# Patient Record
Sex: Female | Born: 1970 | Race: White | Hispanic: No | Marital: Married | Smoking: Never smoker
Health system: Southern US, Community
[De-identification: ages and names within clinical notes are randomized; demographics above are authoritative.]

## PROBLEM LIST (undated history)

## (undated) DIAGNOSIS — E669 Obesity, unspecified: Secondary | ICD-10-CM

## (undated) DIAGNOSIS — E079 Disorder of thyroid, unspecified: Secondary | ICD-10-CM

## (undated) DIAGNOSIS — I2510 Atherosclerotic heart disease of native coronary artery without angina pectoris: Secondary | ICD-10-CM

## (undated) DIAGNOSIS — D696 Thrombocytopenia, unspecified: Secondary | ICD-10-CM

## (undated) DIAGNOSIS — E559 Vitamin D deficiency, unspecified: Secondary | ICD-10-CM

## (undated) DIAGNOSIS — I10 Essential (primary) hypertension: Secondary | ICD-10-CM

## (undated) DIAGNOSIS — E785 Hyperlipidemia, unspecified: Secondary | ICD-10-CM

## (undated) DIAGNOSIS — I2699 Other pulmonary embolism without acute cor pulmonale: Secondary | ICD-10-CM

## (undated) DIAGNOSIS — R760 Raised antibody titer: Secondary | ICD-10-CM

## (undated) DIAGNOSIS — E039 Hypothyroidism, unspecified: Secondary | ICD-10-CM

## (undated) HISTORY — DX: Hyperlipidemia, unspecified: E78.5

## (undated) HISTORY — DX: Raised antibody titer: R76.0

## (undated) HISTORY — DX: Thrombocytopenia, unspecified: D69.6

## (undated) HISTORY — DX: Vitamin D deficiency, unspecified: E55.9

## (undated) HISTORY — DX: Atherosclerotic heart disease of native coronary artery without angina pectoris: I25.10

## (undated) HISTORY — DX: Obesity, unspecified: E66.9

---
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
## 1997-06-20 ENCOUNTER — Inpatient Hospital Stay (HOSPITAL_COMMUNITY): Admission: AD | Admit: 1997-06-20 | Discharge: 1997-06-22 | Payer: Self-pay | Admitting: Obstetrics and Gynecology

## 1997-07-17 ENCOUNTER — Other Ambulatory Visit: Admission: RE | Admit: 1997-07-17 | Discharge: 1997-07-17 | Payer: Self-pay | Admitting: Obstetrics and Gynecology

## 1999-02-17 ENCOUNTER — Other Ambulatory Visit: Admission: RE | Admit: 1999-02-17 | Discharge: 1999-02-17 | Payer: Self-pay | Admitting: Obstetrics and Gynecology

## 1999-08-27 ENCOUNTER — Encounter: Payer: Self-pay | Admitting: Family Medicine

## 1999-08-27 ENCOUNTER — Ambulatory Visit: Admission: RE | Admit: 1999-08-27 | Discharge: 1999-08-27 | Payer: Self-pay | Admitting: Family Medicine

## 2000-07-18 ENCOUNTER — Other Ambulatory Visit: Admission: RE | Admit: 2000-07-18 | Discharge: 2000-07-18 | Payer: Self-pay | Admitting: Obstetrics and Gynecology

## 2002-07-23 ENCOUNTER — Other Ambulatory Visit: Admission: RE | Admit: 2002-07-23 | Discharge: 2002-07-23 | Payer: Self-pay | Admitting: Obstetrics and Gynecology

## 2003-07-15 ENCOUNTER — Ambulatory Visit (HOSPITAL_COMMUNITY): Admission: RE | Admit: 2003-07-15 | Discharge: 2003-07-15 | Payer: Self-pay | Admitting: Obstetrics and Gynecology

## 2003-09-24 ENCOUNTER — Inpatient Hospital Stay (HOSPITAL_COMMUNITY): Admission: AD | Admit: 2003-09-24 | Discharge: 2003-09-27 | Payer: Self-pay | Admitting: Obstetrics and Gynecology

## 2003-10-02 ENCOUNTER — Inpatient Hospital Stay (HOSPITAL_COMMUNITY): Admission: AD | Admit: 2003-10-02 | Discharge: 2003-10-02 | Payer: Self-pay | Admitting: Obstetrics & Gynecology

## 2003-10-04 ENCOUNTER — Emergency Department (HOSPITAL_COMMUNITY): Admission: EM | Admit: 2003-10-04 | Discharge: 2003-10-04 | Payer: Self-pay | Admitting: Emergency Medicine

## 2003-10-30 ENCOUNTER — Other Ambulatory Visit: Admission: RE | Admit: 2003-10-30 | Discharge: 2003-10-30 | Payer: Self-pay | Admitting: Obstetrics and Gynecology

## 2004-02-01 ENCOUNTER — Encounter: Admission: RE | Admit: 2004-02-01 | Discharge: 2004-02-01 | Payer: Self-pay | Admitting: Internal Medicine

## 2004-04-15 ENCOUNTER — Ambulatory Visit (HOSPITAL_COMMUNITY): Admission: RE | Admit: 2004-04-15 | Discharge: 2004-04-15 | Payer: Self-pay | Admitting: Oncology

## 2004-04-15 ENCOUNTER — Ambulatory Visit: Payer: Self-pay | Admitting: Oncology

## 2004-07-04 ENCOUNTER — Ambulatory Visit: Payer: Self-pay | Admitting: Oncology

## 2004-08-19 ENCOUNTER — Ambulatory Visit: Payer: Self-pay | Admitting: Oncology

## 2004-10-31 ENCOUNTER — Other Ambulatory Visit: Admission: RE | Admit: 2004-10-31 | Discharge: 2004-10-31 | Payer: Self-pay | Admitting: Obstetrics and Gynecology

## 2004-12-21 ENCOUNTER — Ambulatory Visit: Payer: Self-pay | Admitting: Oncology

## 2005-07-11 ENCOUNTER — Ambulatory Visit: Payer: Self-pay | Admitting: Oncology

## 2006-08-16 ENCOUNTER — Encounter: Admission: RE | Admit: 2006-08-16 | Discharge: 2006-08-16 | Payer: Self-pay | Admitting: Internal Medicine

## 2008-09-03 ENCOUNTER — Encounter: Admission: RE | Admit: 2008-09-03 | Discharge: 2008-09-03 | Payer: Self-pay | Admitting: Internal Medicine

## 2010-08-26 NOTE — Discharge Summary (Signed)
Jill [name], Jill [name]                         ACCOUNT NO.:  [id number]   MEDICAL RECORD NO.:  [id number]                   PATIENT TYPE:  INP   LOCATION:  9107                                 FACILITY:  [facility]   PHYSICIAN:  Gerrit Friends. Aldona Bar, M.D.                DATE OF BIRTH:  [birth year]   DATE OF ADMISSION:  09/24/2003  DATE OF DISCHARGE:  09/27/2003                                 DISCHARGE SUMMARY   DISCHARGE DIAGNOSES:  1. A 38-week intrauterine pregnancy delivered of a 7 pound, 8 ounce female     infant with Apgar's 9 and 9.  2. Blood type:  A negative, RhoGAM given.  3. History of a pulmonary embolus.  4. Preeclampsia.  5. Induction of labor.   PROCEDURE:  Normal spontaneous delivery.   SUMMARY:  This 40 year old gravida 3, para 2 was admitted at 71 to [redacted] weeks  gestation for induction secondary to preeclampsia.  Her history is  significant in that there is a history of a pulmonary embolus and she was on  Coumadin prior to getting pregnant, and was placed on Lovenox and continued  on Lovenox during her pregnancy.  She was using 40 mg subcutaneously on a  daily basis.  At the time of admission her blood pressures were in the  140/90 range.  Cytotec was placed and had excellent results and put the  patient into good labor, and on the early morning of September 25, 2003 after  just 1 dose of Cytotec, had spontaneous rupture of membranes preceded by the  onset of labor.  She delivered a viable 7 pound, 8 ounce female infant with  Apgar's of 8 and 9 over an intact perineum.   Her postpartum course was totally benign.  Blood pressures returned to  normal levels.  Her labs likewise on the morning of September 26, 2003 were very  normal except for a hemoglobin of 8.2.  Lovenox was begun - 40 mg SQ on the  evening of September 25, 2003 and will continue on a q. 24h basis.  She will be  followed up by her hematologist, Dr. Donnie Coffin, in the very near future.   On the morning of September 27, 2003 she was  ambulating well, tolerating a  regular diet while having normal bowel and bladder function and was  afebrile.  She was bottle feeding.  She did receive RhoGAM on September 26, 2003.  She was ready for discharge.  Accordingly she was given all discharge  instructions and understood all instructions well.   DISCHARGE MEDICATIONS:  1. Motrin 600 mg q.6h p.r.n. pain.  2. Tylox 1  to 2 q.4-6h p.r.n. more severe pain.  3. Ferrous sulfate 300 mg daily.  4. She will continue on her Lovenox 40 mg SQ q.24h.   She will follow up with her hematologist soon, and will see [place] in the office  in approximately 4 week's time.  CONDITION ON DISCHARGE:  Improved.                                              Gerrit Friends. Aldona Bar, M.D.   RMW/MEDQ  D:  09/27/2003  T:  09/29/2003  Job:  [id number]

## 2011-04-05 ENCOUNTER — Other Ambulatory Visit: Payer: Self-pay

## 2011-04-05 ENCOUNTER — Emergency Department (HOSPITAL_COMMUNITY): Payer: Managed Care, Other (non HMO)

## 2011-04-05 ENCOUNTER — Emergency Department (HOSPITAL_COMMUNITY)
Admission: EM | Admit: 2011-04-05 | Discharge: 2011-04-06 | Disposition: A | Discharge status: Home / Self Care | Payer: Managed Care, Other (non HMO) | Attending: Emergency Medicine | Admitting: Emergency Medicine

## 2011-04-05 ENCOUNTER — Encounter: Payer: Self-pay | Admitting: Emergency Medicine

## 2011-04-05 DIAGNOSIS — M549 Dorsalgia, unspecified: Secondary | ICD-10-CM | POA: Insufficient documentation

## 2011-04-05 DIAGNOSIS — Z86711 Personal history of pulmonary embolism: Secondary | ICD-10-CM | POA: Insufficient documentation

## 2011-04-05 DIAGNOSIS — R0789 Other chest pain: Secondary | ICD-10-CM | POA: Insufficient documentation

## 2011-04-05 HISTORY — DX: Other pulmonary embolism without acute cor pulmonale: I26.99

## 2011-04-05 LAB — CBC
HCT: 41.7 % (ref 36.0–46.0)
Hemoglobin: 14.3 g/dL (ref 12.0–15.0)
MCH: 27.8 pg (ref 26.0–34.0)
MCV: 81 fL (ref 78.0–100.0)
RBC: 5.15 MIL/uL — ABNORMAL HIGH (ref 3.87–5.11)

## 2011-04-05 LAB — POCT I-STAT TROPONIN I

## 2011-04-05 NOTE — ED Notes (Signed)
PT. REPORTS LEFT CHEST PAIN RADIATING TO LEFT UPPER BACK ONSET TODAY ,  DENIES SOB , PAIN WORSE WITH DEEP INSPIRATION , NO NAUSEA OR VOMITTING , DENIES DIAPHORESIS .  NO COUGH OR CONGESTION .

## 2011-04-06 ENCOUNTER — Emergency Department (HOSPITAL_COMMUNITY): Payer: Managed Care, Other (non HMO)

## 2011-04-06 LAB — BASIC METABOLIC PANEL
CO2: 28 mEq/L (ref 19–32)
Calcium: 9.7 mg/dL (ref 8.4–10.5)
Creatinine, Ser: 0.94 mg/dL (ref 0.50–1.10)
Glucose, Bld: 97 mg/dL (ref 70–99)
Sodium: 136 mEq/L (ref 135–145)

## 2011-04-06 LAB — POCT I-STAT TROPONIN I

## 2011-04-06 MED ORDER — IOHEXOL 300 MG/ML  SOLN
100.0000 mL | Freq: Once | INTRAMUSCULAR | Status: AC | PRN
  Administered 2011-04-06: 100 mL via INTRAVENOUS

## 2011-04-06 NOTE — ED Provider Notes (Signed)
History     CSN: [id number]  Arrival date & time 04/05/11  2307   First MD Initiated Contact with Patient 04/06/11 [phone]      Chief Complaint  Patient presents with  . Chest Pain    (Consider location/radiation/quality/duration/timing/severity/associated sxs/prior treatment) HPI This is a 40 year old white female with a history of pulmonary embolism 8 years ago. She is no longer on any anticoagulation. She is complaining of chest tightness that began yesterday about noon. It is accompanied by a pulling sensation in her left mid back. This pulling sensation is worse with movement or deep breathing. The discomfort is mild to moderate. There is no shortness of breath although she does feel exacerbation of discomfort with deep breathing. There's been no associated nausea, vomiting, diaphoresis, lower extremity pain or swelling. She has had no recent prolonged car or airplane travel.  Past Medical History  Diagnosis Date  . Pulmonary embolism     History reviewed. No pertinent past surgical history.  No family history on file.  History  Substance Use Topics  . Smoking status: Never Smoker   . Smokeless tobacco: Not on file  . Alcohol Use: No    OB History    Grav Para Term Preterm Abortions TAB SAB Ect Mult Living                  Review of Systems  All other systems reviewed and are negative.    Allergies  Review of patient's allergies indicates no known allergies.  Home Medications   Current Outpatient Rx  Name Route Sig Dispense Refill  . LEVOTHYROXINE SODIUM 125 MCG PO TABS Oral Take 125 mcg by mouth daily.        BP 130/84  Pulse 78  Temp(Src) 98 F (36.7 C) (Oral)  Resp 15  SpO2 98%  LMP 03/21/2011  Physical Exam General: Well-developed, well-nourished female in no acute distress; appearance consistent with age of record HENT: normocephalic, atraumatic Eyes: pupils equal round and reactive to light; extraocular muscles intact Neck: supple Heart:  regular rate and rhythm; no murmurs, rubs or gallops Lungs: clear to auscultation bilaterally Abdomen: soft; nontender; nondistended; no masses or hepatosplenomegaly; bowel sounds present Extremities: No deformity; full range of motion; pulses normal; no edema Neurologic: Awake, alert and oriented; motor function intact in all extremities and symmetric; no facial droop Skin: Warm and dry Psychiatric: Normal mood and affect    ED Course  Procedures (including critical care time)    MDM   Nursing notes and vitals signs, including pulse oximetry, reviewed.  Summary of this visit's results, reviewed by myself:  Labs:  Results for orders placed during the hospital encounter of 04/05/11  CBC      Component Value Range   WBC 17.1 (*) 4.0 - 10.5 (K/uL)   RBC 5.15 (*) 3.87 - 5.11 (MIL/uL)   Hemoglobin 14.3  12.0 - 15.0 (g/dL)   HCT 56.2  13.0 - 86.5 (%)   MCV 81.0  78.0 - 100.0 (fL)   MCH 27.8  26.0 - 34.0 (pg)   MCHC 34.3  30.0 - 36.0 (g/dL)   RDW 78.4  69.6 - 29.5 (%)   Platelets 543 (*) 150 - 400 (K/uL)  BASIC METABOLIC PANEL      Component Value Range   Sodium 136  135 - 145 (mEq/L)   Potassium 3.6  3.5 - 5.1 (mEq/L)   Chloride 99  96 - 112 (mEq/L)   CO2 28  19 - 32 (mEq/L)  Glucose, Bld 97  70 - 99 (mg/dL)   BUN 11  6 - 23 (mg/dL)   Creatinine, Ser 5.78  0.50 - 1.10 (mg/dL)   Calcium 9.7  8.4 - 46.9 (mg/dL)   GFR calc non Af Amer 76 (*) >90 (mL/min)   GFR calc Af Amer 88 (*) >90 (mL/min)  POCT I-STAT TROPONIN I      Component Value Range   Troponin i, poc 0.00  0.00 - 0.08 (ng/mL)   Comment 3           D-DIMER, QUANTITATIVE      Component Value Range   D-Dimer, Quant 0.48  0.00 - 0.48 (ug/mL-FEU)  POCT I-STAT TROPONIN I      Component Value Range   Troponin i, poc 0.00  0.00 - 0.08 (ng/mL)   Comment 3             Dg Chest 2 View  04/06/2011  *RADIOLOGY REPORT*  Clinical Data: Left-sided chest pain, radiating to the left upper back.  Pain worse with deep  inspiration.  CHEST - 2 VIEW  Comparison: None.  Findings: The lungs are well-aerated.  There is no evidence of pleural effusion or pneumothorax.  There is a vague nodular density measuring 2.4 cm at the left lung base; this is not well characterized on the lateral view.  A mass cannot be excluded.  The heart is normal in size; the mediastinal contour is within normal limits.  No acute osseous abnormalities are seen.  IMPRESSION: Vague nodular density measuring 2.4 cm at the left lung base; this is not well characterized on the lateral view.  A mass cannot be excluded.  CT of the chest is recommended for further evaluation.  Original Report Authenticated By: Tonia Ghent, M.D.   Ct Angio Chest W/cm &/or Wo Cm  04/06/2011  *RADIOLOGY REPORT*  Clinical Data:  Chest pain and back pain; history of pulmonary embolus.  CT ANGIOGRAPHY CHEST WITH CONTRAST  Technique:  Multidetector CT imaging of the chest was performed using the standard protocol during bolus administration of intravenous contrast.  Multiplanar CT image reconstructions including MIPs were obtained to evaluate the vascular anatomy.  Contrast: OMNIPAQUE IOHEXOL 300 MG/ML IV SOLN  Comparison:  Chest radiograph performed 04/05/2011  Findings:  There is no evidence of pulmonary embolus.  There is mild focal airspace opacity at the right lung apex, concerning for mild pneumonia.  Minimal bilateral subsegmental atelectasis and mosaic perfusion are noted.  The nodular density noted at the left lung base on chest radiograph appears to have reflected an asymmetrically positioned left nipple shadow.  There is no evidence of pleural effusion or pneumothorax. No masses are identified; no abnormal focal contrast enhancement is seen.  The mediastinum is unremarkable in appearance.  No mediastinal lymphadenopathy is seen.  No pericardial effusion is identified. The great vessels are unremarkable in appearance.  No axillary lymphadenopathy is seen.  The  visualized portions of the thyroid gland are unremarkable in appearance.  The visualized portions of the liver and spleen are unremarkable. The visualized portions of the pancreas, gallbladder, stomach, adrenal glands and kidneys are within normal limits.  No acute osseous abnormalities are seen.  Review of the MIP images confirms the above findings.  IMPRESSION:  1.  No evidence of pulmonary embolus. 2.  Mild focal airspace opacity at the right lung apex, concerning for mild pneumonia. 3.  Minimal bilateral subsegmental atelectasis and mosaic perfusion noted.  Original Report Authenticated By: Tonia Ghent, M.D.  EKG Interpretation:  Date & Time: 04/06/2011 11:27 PM  Rate: 84  Rhythm: normal sinus rhythm  QRS Axis: right  Intervals: normal  ST/T Wave abnormalities: normal  Conduction Disutrbances:none  Narrative Interpretation: Small Q waves in inferior leads; poor R-wave progression  Old EKG Reviewed: none available  6:06 AM Pulling sensation in back still present but improved. Patient advised of essentially negative CT scan findings. Patient has no symptoms suggestive of pneumonia so we'll not treat with antibiotics at this time.          Hanley Seamen, MD 04/06/11 [phone]

## 2011-04-06 NOTE — ED Notes (Signed)
Patient transported to CT 

## 2011-04-12 ENCOUNTER — Other Ambulatory Visit: Payer: Self-pay | Admitting: Internal Medicine

## 2011-04-12 DIAGNOSIS — R42 Dizziness and giddiness: Secondary | ICD-10-CM

## 2011-04-15 ENCOUNTER — Ambulatory Visit
Admission: RE | Admit: 2011-04-15 | Discharge: 2011-04-15 | Disposition: A | Discharge status: Home / Self Care | Payer: Managed Care, Other (non HMO) | Source: Ambulatory Visit | Attending: Internal Medicine | Admitting: Internal Medicine

## 2011-04-15 DIAGNOSIS — R42 Dizziness and giddiness: Secondary | ICD-10-CM

## 2011-04-15 MED ORDER — GADOBENATE DIMEGLUMINE 529 MG/ML IV SOLN
17.0000 mL | Freq: Once | INTRAVENOUS | Status: AC | PRN
  Administered 2011-04-15: 17 mL via INTRAVENOUS

## 2012-06-19 ENCOUNTER — Other Ambulatory Visit: Payer: Self-pay | Admitting: Internal Medicine

## 2012-06-21 ENCOUNTER — Ambulatory Visit
Admission: RE | Admit: 2012-06-21 | Discharge: 2012-06-21 | Disposition: A | Discharge status: Home / Self Care | Payer: Managed Care, Other (non HMO) | Source: Ambulatory Visit | Attending: Internal Medicine | Admitting: Internal Medicine

## 2012-06-21 DIAGNOSIS — E039 Hypothyroidism, unspecified: Secondary | ICD-10-CM

## 2014-04-10 HISTORY — PX: NOVASURE ABLATION: SHX5394

## 2015-12-06 DIAGNOSIS — H01005 Unspecified blepharitis left lower eyelid: Secondary | ICD-10-CM | POA: Diagnosis not present

## 2015-12-06 DIAGNOSIS — H10433 Chronic follicular conjunctivitis, bilateral: Secondary | ICD-10-CM | POA: Diagnosis not present

## 2015-12-06 DIAGNOSIS — H5200 Hypermetropia, unspecified eye: Secondary | ICD-10-CM | POA: Diagnosis not present

## 2015-12-06 DIAGNOSIS — H01002 Unspecified blepharitis right lower eyelid: Secondary | ICD-10-CM | POA: Diagnosis not present

## 2015-12-17 DIAGNOSIS — N760 Acute vaginitis: Secondary | ICD-10-CM | POA: Diagnosis not present

## 2015-12-17 DIAGNOSIS — Z01419 Encounter for gynecological examination (general) (routine) without abnormal findings: Secondary | ICD-10-CM | POA: Diagnosis not present

## 2015-12-17 DIAGNOSIS — Z6836 Body mass index (BMI) 36.0-36.9, adult: Secondary | ICD-10-CM | POA: Diagnosis not present

## 2015-12-23 DIAGNOSIS — Z0000 Encounter for general adult medical examination without abnormal findings: Secondary | ICD-10-CM | POA: Diagnosis not present

## 2015-12-30 DIAGNOSIS — E6609 Other obesity due to excess calories: Secondary | ICD-10-CM | POA: Diagnosis not present

## 2015-12-30 DIAGNOSIS — Z23 Encounter for immunization: Secondary | ICD-10-CM | POA: Diagnosis not present

## 2015-12-30 DIAGNOSIS — E559 Vitamin D deficiency, unspecified: Secondary | ICD-10-CM | POA: Diagnosis not present

## 2015-12-30 DIAGNOSIS — Z0000 Encounter for general adult medical examination without abnormal findings: Secondary | ICD-10-CM | POA: Diagnosis not present

## 2015-12-30 DIAGNOSIS — R072 Precordial pain: Secondary | ICD-10-CM | POA: Diagnosis not present

## 2015-12-30 DIAGNOSIS — R079 Chest pain, unspecified: Secondary | ICD-10-CM | POA: Diagnosis not present

## 2016-02-03 DIAGNOSIS — R0789 Other chest pain: Secondary | ICD-10-CM | POA: Diagnosis not present

## 2016-02-03 DIAGNOSIS — R0602 Shortness of breath: Secondary | ICD-10-CM | POA: Diagnosis not present

## 2016-02-18 DIAGNOSIS — Z01419 Encounter for gynecological examination (general) (routine) without abnormal findings: Secondary | ICD-10-CM | POA: Diagnosis not present

## 2016-02-18 DIAGNOSIS — Z1231 Encounter for screening mammogram for malignant neoplasm of breast: Secondary | ICD-10-CM | POA: Diagnosis not present

## 2016-02-18 DIAGNOSIS — Z6836 Body mass index (BMI) 36.0-36.9, adult: Secondary | ICD-10-CM | POA: Diagnosis not present

## 2016-02-18 DIAGNOSIS — Z124 Encounter for screening for malignant neoplasm of cervix: Secondary | ICD-10-CM | POA: Diagnosis not present

## 2016-02-27 DIAGNOSIS — H6691 Otitis media, unspecified, right ear: Secondary | ICD-10-CM | POA: Diagnosis not present

## 2016-02-27 DIAGNOSIS — M542 Cervicalgia: Secondary | ICD-10-CM | POA: Diagnosis not present

## 2016-02-27 DIAGNOSIS — J019 Acute sinusitis, unspecified: Secondary | ICD-10-CM | POA: Diagnosis not present

## 2016-03-07 DIAGNOSIS — R87622 Low grade squamous intraepithelial lesion on cytologic smear of vagina (LGSIL): Secondary | ICD-10-CM | POA: Diagnosis not present

## 2016-03-07 DIAGNOSIS — Z6836 Body mass index (BMI) 36.0-36.9, adult: Secondary | ICD-10-CM | POA: Diagnosis not present

## 2016-03-29 DIAGNOSIS — Z3202 Encounter for pregnancy test, result negative: Secondary | ICD-10-CM | POA: Diagnosis not present

## 2016-03-29 DIAGNOSIS — N879 Dysplasia of cervix uteri, unspecified: Secondary | ICD-10-CM | POA: Diagnosis not present

## 2016-03-29 DIAGNOSIS — Z6836 Body mass index (BMI) 36.0-36.9, adult: Secondary | ICD-10-CM | POA: Diagnosis not present

## 2016-03-29 DIAGNOSIS — R87612 Low grade squamous intraepithelial lesion on cytologic smear of cervix (LGSIL): Secondary | ICD-10-CM | POA: Diagnosis not present

## 2016-05-30 ENCOUNTER — Encounter: Payer: Self-pay | Admitting: Emergency Medicine

## 2016-05-30 ENCOUNTER — Emergency Department (HOSPITAL_BASED_OUTPATIENT_CLINIC_OR_DEPARTMENT_OTHER)
Admission: EM | Admit: 2016-05-30 | Discharge: 2016-05-30 | Disposition: A | Discharge status: Home / Self Care | Payer: BLUE CROSS/BLUE SHIELD | Attending: Emergency Medicine | Admitting: Emergency Medicine

## 2016-05-30 ENCOUNTER — Emergency Department (HOSPITAL_BASED_OUTPATIENT_CLINIC_OR_DEPARTMENT_OTHER): Payer: BLUE CROSS/BLUE SHIELD

## 2016-05-30 DIAGNOSIS — Z79899 Other long term (current) drug therapy: Secondary | ICD-10-CM | POA: Insufficient documentation

## 2016-05-30 DIAGNOSIS — R51 Headache: Secondary | ICD-10-CM | POA: Diagnosis not present

## 2016-05-30 DIAGNOSIS — I10 Essential (primary) hypertension: Secondary | ICD-10-CM | POA: Diagnosis not present

## 2016-05-30 HISTORY — DX: Essential (primary) hypertension: I10

## 2016-05-30 HISTORY — DX: Disorder of thyroid, unspecified: E07.9

## 2016-05-30 LAB — BASIC METABOLIC PANEL
ANION GAP: 6 (ref 5–15)
BUN: 13 mg/dL (ref 6–20)
CO2: 24 mmol/L (ref 22–32)
Calcium: 9.2 mg/dL (ref 8.9–10.3)
Chloride: 105 mmol/L (ref 101–111)
Creatinine, Ser: 0.92 mg/dL (ref 0.44–1.00)
GFR calc Af Amer: >60 mL/min (ref >60)
GFR calc non Af Amer: >60 mL/min (ref >60)
GLUCOSE: 96 mg/dL (ref 65–99)
POTASSIUM: 3.4 mmol/L — AB (ref 3.5–5.1)
Sodium: 135 mmol/L (ref 135–145)

## 2016-05-30 LAB — CBC WITH DIFFERENTIAL/PLATELET
BASOS ABS: 0.1 10*3/uL (ref 0.0–0.1)
Basophils Relative: 0 %
Eosinophils Absolute: 0.4 10*3/uL (ref 0.0–0.7)
Eosinophils Relative: 3 %
HEMATOCRIT: 42.5 % (ref 36.0–46.0)
Hemoglobin: 14.3 g/dL (ref 12.0–15.0)
LYMPHS ABS: 4.7 10*3/uL — AB (ref 0.7–4.0)
LYMPHS PCT: 38 %
MCH: 27.8 pg (ref 26.0–34.0)
MCHC: 33.6 g/dL (ref 30.0–36.0)
MCV: 82.5 fL (ref 78.0–100.0)
MONO ABS: 0.8 10*3/uL (ref 0.1–1.0)
Monocytes Relative: 7 %
NEUTROS ABS: 6.3 10*3/uL (ref 1.7–7.7)
Neutrophils Relative %: 52 %
Platelets: 496 10*3/uL — ABNORMAL HIGH (ref 150–400)
RBC: 5.15 MIL/uL — AB (ref 3.87–5.11)
RDW: 13.9 % (ref 11.5–15.5)
WBC: 12.3 10*3/uL — ABNORMAL HIGH (ref 4.0–10.5)

## 2016-05-30 LAB — TROPONIN I: Troponin I: <0.03 ng/mL (ref <0.03)

## 2016-05-30 MED ORDER — ETODOLAC 300 MG PO CAPS: 300.0000 mg | ORAL_CAPSULE | Freq: Three times a day (TID) | ORAL | 0 refills | Status: DC

## 2016-05-30 NOTE — ED Triage Notes (Signed)
Complains of headache had co worker take bp and it was elevated took extra bp med.  Complains of blurred vision, arm pain and dizziness.

## 2016-05-30 NOTE — ED Provider Notes (Signed)
[facility] Provider [facility]   CSN: [id number] Arrival date & time: 05/30/16  1356     History   Chief Complaint Chief Complaint  Patient presents with  . Hypertension    HPI Jill [name] is a 46 y.o. female.  HPI Pt was at work today when she started developing a headache around 10 am.   The pain was on the left side towards the front.  No radiation of the pain.  She felt a little dizzy and then she developed a dull pain in the left arm.   The vision was not blurry or double but it felt different.   No photophobia.  Her eyes feel sore as well. At work the nurse took her BP and it was elevated.  She was sent to the ED.   She has history of HTN and takes losartan.  She took an extra one after noticing her BP was high  Past Medical History:  Diagnosis Date  . Hypertension   . Pulmonary embolism (HCC)   . Thyroid disease     There are no active problems to display for this patient.   Past Surgical History:  Procedure Laterality Date  . NOVASURE ABLATION      OB History    No data available       Home Medications    Prior to Admission medications   Medication Sig Start Date End Date Taking? Authorizing Provider  losartan (COZAAR) 50 MG tablet Take 50 mg by mouth daily.   Yes Historical Provider, MD  etodolac (LODINE) 300 MG capsule Take 1 capsule (300 mg total) by mouth every 8 (eight) hours. 05/30/16   Linwood Dibbles, MD  levothyroxine (SYNTHROID, LEVOTHROID) 125 MCG tablet Take 125 mcg by mouth daily.      Historical Provider, MD    Family History No family history on file.  Social History Social History  Substance Use Topics  . Smoking status: Never Smoker  . Smokeless tobacco: Never Used  . Alcohol use No     Allergies   Patient has no known allergies.   Review of Systems Review of Systems  All other systems reviewed and are negative.    Physical Exam Updated Vital Signs BP (!) 140/105   Pulse 92   Temp 98.5 F (36.9 C) (Oral)    Resp 19   Ht 5\' 3"  (1.6 m)   Wt 93.4 kg   LMP 03/21/2011   SpO2 99%   BMI 36.49 kg/m   Physical Exam  Constitutional: She is oriented to person, place, and time. She appears well-developed and well-nourished. No distress.  HENT:  Head: Normocephalic and atraumatic.  Right Ear: External ear normal.  Left Ear: External ear normal.  Mouth/Throat: Oropharynx is clear and moist.  Eyes: Conjunctivae are normal. Right eye exhibits no discharge. Left eye exhibits no discharge. No scleral icterus.  Neck: Neck supple. No tracheal deviation present.  Cardiovascular: Normal rate, regular rhythm and intact distal pulses.   Pulmonary/Chest: Effort normal and breath sounds normal. No stridor. No respiratory distress. She has no wheezes. She has no rales.  Abdominal: Soft. Bowel sounds are normal. She exhibits no distension. There is no tenderness. There is no rebound and no guarding.  Musculoskeletal: She exhibits no edema or tenderness.  Neurological: She is alert and oriented to person, place, and time. She has normal strength. No cranial nerve deficit (no facial droop, extraocular movements intact, no slurred speech) or sensory deficit. She exhibits normal muscle tone.  She displays no seizure activity. Coordination normal.  No pronator drift bilateral upper extrem, able to hold both legs off bed for 5 seconds, sensation intact in all extremities, no visual field cuts, no left or right sided neglect, normal finger-nose exam bilaterally, no nystagmus noted   Skin: Skin is warm and dry. No rash noted.  Psychiatric: She has a normal mood and affect.  Nursing note and vitals reviewed.    ED Treatments / Results  Labs (all labs ordered are listed, but only abnormal results are displayed) Labs Reviewed  CBC WITH DIFFERENTIAL/PLATELET - Abnormal; Notable for the following:       Result Value   WBC 12.3 (*)    RBC 5.15 (*)    Platelets 496 (*)    Lymphs Abs 4.7 (*)    All other components within  normal limits  BASIC METABOLIC PANEL - Abnormal; Notable for the following:    Potassium 3.4 (*)    All other components within normal limits  TROPONIN I    EKG  EKG Interpretation  Date/Time:  Tuesday May 30 2016 14:14:26 EST Ventricular Rate:  91 PR Interval:  156 QRS Duration: 96 QT Interval:  370 QTC Calculation: 455 R Axis:   97 Text Interpretation:  Normal sinus rhythm Rightward axis Low voltage QRS Possible Inferior infarct , age undetermined Cannot rule out Anterior infarct , age undetermined Abnormal ECG No significant change since last tracing Confirmed by Leialoha Hanna  MD-J, Catie Chiao [phone]) on 05/30/2016 3:54:34 PM       Radiology Ct Head Wo Contrast  Result Date: 05/30/2016 CLINICAL DATA:  46 year old female with history of elevated blood pressure. Headache. EXAM: CT HEAD WITHOUT CONTRAST TECHNIQUE: Contiguous axial images were obtained from the base of the skull through the vertex without intravenous contrast. COMPARISON:  No priors. FINDINGS: Brain: Small well-defined focus of low attenuation in the right basal ganglia (image 9 of series 2), compatible subtle lacunar infarct. No evidence of acute infarction, hemorrhage, hydrocephalus, extra-axial collection or mass lesion/mass effect. Vascular: No hyperdense vessel or unexpected calcification. Skull: Normal. Negative for fracture or focal lesion. Sinuses/Orbits: No acute finding. Other: None. IMPRESSION: 1. No acute intracranial abnormalities. 2. 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 Electronically Signed   By: Trudie Reed M.D.   On: 05/30/2016 16:22    Procedures Procedures (including critical care time)  Medications Ordered in ED Medications - No data to display   Initial Impression / Assessment and Plan / ED Course  I have reviewed the triage vital signs and the nursing notes.  Pertinent labs & imaging results that were available during my care of the patient were reviewed by me and considered in my  medical decision making (see chart for details).   patient's laboratory tests are reassuring. CT scan surprisingly shows a possible old lacunar infarct.  She has no neurologic symptoms and her symptoms have completely resolved. I doubt stroke or TIA associated with her symptoms today. However she does have high blood pressure and there certainly is a possibility she may have had a stroke in the past. Discussed having her following up with her primary care doctor to arrange for an outpatient MRI. Patient will also monitor her blood pressure closely. She will increase her losartan dose to 100 mg daily if her blood pressures anything greater than 140/100 when she takes tomorrow.  Final Clinical Impressions(s) / ED Diagnoses   Final diagnoses:  Hypertension, unspecified type    New Prescriptions New Prescriptions  ETODOLAC (LODINE) 300 MG CAPSULE    Take 1 capsule (300 mg total) by mouth every 8 (eight) hours.     Linwood DibblesJon Fareeda Downard, MD 05/30/16 [id number]

## 2016-05-30 NOTE — Discharge Instructions (Signed)
Follow-up with your primary care doctor to check on her blood pressure.  Double checked her blood pressure tomorrow. If you're still running anything above 140/100 increase your losartan to 100 mg daily.  As we discussed, please talk to her doctor about an outpatient MRI of the brain to further evaluate the questionable finding noted on the CT scan (old small lacunar infarct)

## 2016-05-31 DIAGNOSIS — I6340 Cerebral infarction due to embolism of unspecified cerebral artery: Secondary | ICD-10-CM | POA: Diagnosis not present

## 2016-05-31 DIAGNOSIS — I10 Essential (primary) hypertension: Secondary | ICD-10-CM | POA: Diagnosis not present

## 2016-05-31 DIAGNOSIS — G44209 Tension-type headache, unspecified, not intractable: Secondary | ICD-10-CM | POA: Diagnosis not present

## 2016-05-31 DIAGNOSIS — E782 Mixed hyperlipidemia: Secondary | ICD-10-CM | POA: Diagnosis not present

## 2016-06-06 ENCOUNTER — Other Ambulatory Visit: Payer: Self-pay | Admitting: Internal Medicine

## 2016-06-06 DIAGNOSIS — I6340 Cerebral infarction due to embolism of unspecified cerebral artery: Secondary | ICD-10-CM

## 2016-06-07 ENCOUNTER — Ambulatory Visit
Admission: RE | Admit: 2016-06-07 | Discharge: 2016-06-07 | Disposition: A | Discharge status: Home / Self Care | Payer: BLUE CROSS/BLUE SHIELD | Source: Ambulatory Visit | Attending: Internal Medicine | Admitting: Internal Medicine

## 2016-06-07 DIAGNOSIS — I6340 Cerebral infarction due to embolism of unspecified cerebral artery: Secondary | ICD-10-CM

## 2016-06-07 DIAGNOSIS — I63233 Cerebral infarction due to unspecified occlusion or stenosis of bilateral carotid arteries: Secondary | ICD-10-CM | POA: Diagnosis not present

## 2016-06-07 DIAGNOSIS — R51 Headache: Secondary | ICD-10-CM | POA: Diagnosis not present

## 2016-06-09 DIAGNOSIS — E6609 Other obesity due to excess calories: Secondary | ICD-10-CM | POA: Diagnosis not present

## 2016-06-09 DIAGNOSIS — I779 Disorder of arteries and arterioles, unspecified: Secondary | ICD-10-CM | POA: Diagnosis not present

## 2016-06-09 DIAGNOSIS — E782 Mixed hyperlipidemia: Secondary | ICD-10-CM | POA: Diagnosis not present

## 2016-06-09 DIAGNOSIS — I10 Essential (primary) hypertension: Secondary | ICD-10-CM | POA: Diagnosis not present

## 2016-07-14 DIAGNOSIS — R29898 Other symptoms and signs involving the musculoskeletal system: Secondary | ICD-10-CM | POA: Diagnosis not present

## 2016-07-14 DIAGNOSIS — M542 Cervicalgia: Secondary | ICD-10-CM | POA: Diagnosis not present

## 2016-07-14 DIAGNOSIS — R600 Localized edema: Secondary | ICD-10-CM | POA: Diagnosis not present

## 2016-07-18 ENCOUNTER — Ambulatory Visit (INDEPENDENT_AMBULATORY_CARE_PROVIDER_SITE_OTHER): Payer: BLUE CROSS/BLUE SHIELD | Admitting: Neurology

## 2016-07-18 ENCOUNTER — Encounter: Payer: Self-pay | Admitting: Neurology

## 2016-07-18 VITALS — BP 128/88 | HR 78 | Ht 63.0 in | Wt 203.4 lb

## 2016-07-18 DIAGNOSIS — R400 Somnolence: Secondary | ICD-10-CM | POA: Diagnosis not present

## 2016-07-18 DIAGNOSIS — R200 Anesthesia of skin: Secondary | ICD-10-CM

## 2016-07-18 DIAGNOSIS — R0681 Apnea, not elsewhere classified: Secondary | ICD-10-CM

## 2016-07-18 DIAGNOSIS — R29898 Other symptoms and signs involving the musculoskeletal system: Secondary | ICD-10-CM | POA: Diagnosis not present

## 2016-07-18 DIAGNOSIS — R0683 Snoring: Secondary | ICD-10-CM

## 2016-07-18 NOTE — Patient Instructions (Signed)
Remember to drink plenty of fluid, eat healthy meals and do not skip any meals. Try to eat protein with a every meal and eat a healthy snack such as fruit or nuts in between meals. Try to keep a regular sleep-wake schedule and try to exercise daily, particularly in the form of walking, 20-30 minutes a day, if you can.   As far as diagnostic testing: Sleep evaluation, emg/ncs  I would like to see you back in 2-4 weeks, sooner if we need to. Please call us with any interim questions, concerns, problems, updates or refill requests.    Our phone number is [phone]. We also have an after hours call service for urgent matters and there is a physician on-call for urgent questions. For any emergencies you know to call 911 or go to the nearest emergency room

## 2016-07-18 NOTE — Progress Notes (Signed)
[facility]    Provider:  Dr Lucia Gaskins Referring Provider: Georgianne Fick, MD Primary Care Physician:  Georgianne Fick, MD  CC:  Left arm numbness  HPI:  Jill [name] is a 46 y.o. female here as a referral from Dr. Nicholos Johns for left arm numbness and weakness. Started a few weeks ago. The whole left arm is affected. Not painful. It involves all the fingers and the whole hand. 2 weeks ago there were no inciting events, no trauma. MRI beginning of March was negative but these symptoms started after that MRI. Shaking it out helps. Happens at least 3x a day. Lasts a few minutes.Not nocturnal. No known triggers. Not getting worse. Sometimes when she uses the blood pressure cuff she can reproduce the symptoms and she has been using the cuff a lot. No neck pai or radicular symptoms. She stopped using the cuff on the left arm and no improvement. No weakness.She snores significantly, tired all the time, wakes herself up snoring, witnessed apneic events by husband, dry mouth in the morning, morning headaches.  No other focal neurologic deficits, associated symptoms, inciting events or modifiable factors.  Reviewed notes, labs and imaging from outside physicians, which showed:   Review primary care notes. Patient presented in early April for left upper extremity numbness. Symptoms occur throughout the whole left upper extremity, extending from the shoulder to the fingertips. Symptoms occur approximately 3-4 times a day and resolve within a few minutes. Symptoms generally resolve after she shakes her hand. She does not report any weakness of her left upper extremity. She denied any change in color. No trauma.   Review of Systems: Patient complains of symptoms per HPI as well as the following symptoms: headache, numbness, snoring, fatigue, anxiety, joint pain. Pertinent negatives per HPI. All others negative.   Social History   Social History  . Marital status: Married   Spouse name: N/A  . Number of children: 3  . Years of education: 12   Occupational History  . [employer]    Social History Main Topics  . Smoking status: Never Smoker  . Smokeless tobacco: Never Used  . Alcohol use No     Comment: Rare  . Drug use: No  . Sexual activity: Not on file     Comment: Married   Other Topics Concern  . Not on file   Social History Narrative   Lives at home w/ spouse and 3 children, has 1 step-son, 2 dogs   Right-handed   Caffeine: 2 glasses of tea per day    Family History  Problem Relation Age of Onset  . Hypertension Mother   . Breast cancer Mother   . Skin cancer Father   . Stroke Father   . Heart attack Father   . Hypertension Father   . High Cholesterol Father   . Neuropathy Neg Hx     Past Medical History:  Diagnosis Date  . Coronary artery disease    Left-sided  . Hyperlipidemia   . Hypertension   . Lupus anticoagulant positive   . Obesity   . Pulmonary embolism (HCC)   . Thrombocytopenia (HCC)   . Thyroid disease   . Vitamin D deficiency     Past Surgical History:  Procedure Laterality Date  . NOVASURE ABLATION  2016    Current Outpatient Prescriptions  Medication Sig Dispense Refill  . amLODipine (NORVASC) 5 MG tablet Take 5 mg by mouth daily.     Marland Kitchen aspirin EC 81 MG tablet Take 81  mg by mouth daily.    Marland Kitchen atorvastatin (LIPITOR) 40 MG tablet Take 40 mg by mouth daily at 6 PM.     . losartan (COZAAR) 100 MG tablet Take 100 mg by mouth daily.     Marland Kitchen SYNTHROID 200 MCG tablet Take 200 mcg by mouth daily before breakfast.      No current facility-administered medications for this visit.     Allergies as of 07/18/2016  . (No Known Allergies)    Vitals: BP 128/88   Pulse 78   Ht  (1.6 m)   Wt 203 lb 6.4 oz (92.3 kg)   BMI 36.03 kg/m  Last Weight:  Wt Readings from Last 1 Encounters:  07/18/16 203 lb 6.4 oz (92.3 kg)   Last Height:   Ht Readings from Last 1 Encounters:  07/18/16  (1.6 m)     Physical exam: Exam: Gen: NAD, conversant, well nourised, obese, well groomed                     CV: RRR, no MRG. No Carotid Bruits. No peripheral edema, warm, nontender Eyes: Conjunctivae clear without exudates or hemorrhage  Neuro: Detailed Neurologic Exam  Speech:    Speech is normal; fluent and spontaneous with normal comprehension.  Cognition:    The patient is oriented to person, place, and time;     recent and remote memory intact;     language fluent;     normal attention, concentration,     fund of knowledge Cranial Nerves:    The pupils are equal, round, and reactive to light. The fundi are normal and spontaneous venous pulsations are present. Visual fields are full to finger confrontation. Extraocular movements are intact. Trigeminal sensation is intact and the muscles of mastication are normal. The face is symmetric. The palate elevates in the midline. Hearing intact. Voice is normal. Shoulder shrug is normal. The tongue has normal motion without fasciculations.   Coordination:    Normal finger to nose and heel to shin. Normal rapid alternating movements.   Gait:    Heel-toe and tandem gait are normal.   Motor Observation:    No asymmetry, no atrophy, and no involuntary movements noted. Tone:    Normal muscle tone.    Posture:    Posture is normal. normal erect    Strength: Left arm proximal weakness otherwise strength is V/V in the upper and lower limbs.      Sensation: intact to LT     Reflex Exam:  DTR's:    Deep tendon reflexes in the upper and lower extremities are normal bilaterally.   Toes:    The toes are downgoing bilaterally.   Clonus:    Clonus is absent.      Assessment/Plan:  Patient with left arm numbness and weakness. Also with snoring, daytime fatigue, obesity, witnessed apneic events.  -Needs emg/ncs - If symptoms worsen likely needs a repeat MRI brain and/or cervical spine, she is to proceed to ED if acute worsening - Sleep  evaluation: She snores significantly, tired all the time, wakes herself up snoring, witnessed apneic events by husband, dry mouth in the morning, morning headaches.  ESS 16 (scanned)  Naomie Dean, MD  [facility] [address] [place], [place] [address]  Phone [phone] Fax [phone]

## 2016-07-21 DIAGNOSIS — E782 Mixed hyperlipidemia: Secondary | ICD-10-CM | POA: Diagnosis not present

## 2016-07-21 DIAGNOSIS — R600 Localized edema: Secondary | ICD-10-CM | POA: Diagnosis not present

## 2016-07-21 DIAGNOSIS — I10 Essential (primary) hypertension: Secondary | ICD-10-CM | POA: Diagnosis not present

## 2016-07-28 DIAGNOSIS — D473 Essential (hemorrhagic) thrombocythemia: Secondary | ICD-10-CM | POA: Diagnosis not present

## 2016-07-28 DIAGNOSIS — E039 Hypothyroidism, unspecified: Secondary | ICD-10-CM | POA: Diagnosis not present

## 2016-07-28 DIAGNOSIS — E782 Mixed hyperlipidemia: Secondary | ICD-10-CM | POA: Diagnosis not present

## 2016-07-28 DIAGNOSIS — I10 Essential (primary) hypertension: Secondary | ICD-10-CM | POA: Diagnosis not present

## 2016-08-12 DIAGNOSIS — W57XXXA Bitten or stung by nonvenomous insect and other nonvenomous arthropods, initial encounter: Secondary | ICD-10-CM | POA: Diagnosis not present

## 2016-08-12 DIAGNOSIS — R51 Headache: Secondary | ICD-10-CM | POA: Diagnosis not present

## 2016-08-12 DIAGNOSIS — R52 Pain, unspecified: Secondary | ICD-10-CM | POA: Diagnosis not present

## 2016-08-15 ENCOUNTER — Ambulatory Visit (INDEPENDENT_AMBULATORY_CARE_PROVIDER_SITE_OTHER): Payer: BLUE CROSS/BLUE SHIELD | Admitting: Neurology

## 2016-08-15 ENCOUNTER — Encounter: Payer: Self-pay | Admitting: Neurology

## 2016-08-15 VITALS — BP 132/78 | HR 72 | Resp 16 | Ht 63.0 in | Wt 205.0 lb

## 2016-08-15 DIAGNOSIS — R400 Somnolence: Secondary | ICD-10-CM | POA: Diagnosis not present

## 2016-08-15 DIAGNOSIS — R0683 Snoring: Secondary | ICD-10-CM | POA: Diagnosis not present

## 2016-08-15 DIAGNOSIS — G2581 Restless legs syndrome: Secondary | ICD-10-CM

## 2016-08-15 DIAGNOSIS — R51 Headache: Secondary | ICD-10-CM

## 2016-08-15 DIAGNOSIS — R351 Nocturia: Secondary | ICD-10-CM | POA: Diagnosis not present

## 2016-08-15 DIAGNOSIS — E669 Obesity, unspecified: Secondary | ICD-10-CM

## 2016-08-15 DIAGNOSIS — R519 Headache, unspecified: Secondary | ICD-10-CM

## 2016-08-15 DIAGNOSIS — G4719 Other hypersomnia: Secondary | ICD-10-CM | POA: Diagnosis not present

## 2016-08-15 DIAGNOSIS — G4761 Periodic limb movement disorder: Secondary | ICD-10-CM | POA: Diagnosis not present

## 2016-08-15 DIAGNOSIS — Z86711 Personal history of pulmonary embolism: Secondary | ICD-10-CM | POA: Diagnosis not present

## 2016-08-15 DIAGNOSIS — I6522 Occlusion and stenosis of left carotid artery: Secondary | ICD-10-CM

## 2016-08-15 NOTE — Patient Instructions (Signed)

## 2016-08-15 NOTE — Progress Notes (Signed)
Subjective:    Jill [name] ID: Jill Jill [name] is a 46 y.o. female.  HPI     Jill Foley, MD, PhD [facility] [address], [address] [address] [place], [place] [address]  Dear Jill Jill [name],   I saw your Jill [name], Jill Jill [name], upon your kind request in my clinic today for initial consultation of Jill Jill [name] sleep disorder, in particular, concern for underlying obstructive sleep apnea. Jill Jill [name] is unaccompanied today. As you know, Jill Jill [name] is a 46 year old right-handed woman with an underlying medical history of high platelet count, L carotid artery disease (50-69% L ICA on 06/07/16, not CAD as per chart), hypertension, hyperlipidemia, left upper extremity numbness and tingling, lupus anticoagulant positive, history of PE, thyroid disease, vitamin D deficiency and obesity, who reports snoring and excessive daytime somnolence as well as witnessed breathing pauses while asleep per husband's report and morning headaches. I reviewed your office note from 07/18/2016. Jill Jill [name] is scheduled for EMG/NCV later this week.   Jill Jill [name] Epworth sleepiness score is 19 out of 24, fatigue score is 40 out of 63. While Jill Jill [name] has not fallen asleep at Jill wheel, Jill Jill [name] has had to pull over to take a nap. Jill Jill [name] sister has mentioned sleep apnea concerns to Jill Jill [name] as well. Jill Jill [name] endorses restless leg symptoms and having to move Jill Jill [name] legs at night. These have increased lately as well. Bedtime is usually between 10 and 11 PM, wakeup time between 5 and 6 AM. Jill Jill [name] often wakes up with a headache. Jill Jill [name] has nocturia about twice per average night. Jill Jill [name] is a nonsmoker, drinks alcohol infrequently about once a month and caffeine in Jill form of coffee, 2 cups per day and usually decaf tea. Jill Jill [name] works for a [employer]ilities [employer]. Jill Jill [name] has 3 children. Jill Jill [name] does have pets that sometimes disturb Jill Jill [name] sleep. Jill Jill [name] denies any family history of obstructive sleep apnea. Jill Jill [name] had a PE in 2004. Jill Jill [name] was treated with Lovenox and Coumadin and is currently only on  baby aspirin. At Jill time, Jill Jill [name] was on birth control and it was attributed to hormones.  Jill Jill [name] Past Medical History Is Significant For: Past Medical History:  Diagnosis Date  . Coronary artery disease    Left-sided  . Hyperlipidemia   . Hypertension   . Lupus anticoagulant positive   . Obesity   . Pulmonary embolism (HCC)   . Thrombocytopenia (HCC)   . Thyroid disease   . Vitamin D deficiency     Jill Jill [name] Past Surgical History Is Significant For: Past Surgical History:  Procedure Laterality Date  . NOVASURE ABLATION  2016    Jill Jill [name] Family History Is Significant For: Family History  Problem Relation Age of Onset  . Hypertension Mother   . Breast cancer Mother   . Skin cancer Father   . Stroke Father   . Heart attack Father   . Hypertension Father   . High Cholesterol Father   . Neuropathy Neg Hx     Jill Jill [name] Social History Is Significant For: Social History   Social History  . Marital status: Married    Spouse name: N/A  . Number of children: 3  . Years of education: 12   Occupational History  . [employer]    Social History Main Topics  . Smoking status: Never Smoker  . Smokeless tobacco: Never Used  . Alcohol use Yes     Comment: Rare  . Drug use: No  . Sexual activity: Not Asked     Comment: Married   Other Topics Concern  .  None   Social History Narrative   Lives at home w/ spouse and 3 children, has 1 step-son, 2 dogs   Right-handed   Caffeine: 2 glasses of tea per day    Jill Jill [name] Allergies Are:  No Known Allergies:   Jill Jill [name] Current Medications Are:  Outpatient Encounter Prescriptions as of 08/15/2016  Medication Sig  . amLODipine (NORVASC) 5 MG tablet Take 5 mg by mouth daily.   Marland Kitchen. aspirin EC 81 MG tablet Take 81 mg by mouth daily.  Marland Kitchen. atorvastatin (LIPITOR) 40 MG tablet Take 40 mg by mouth daily at 6 PM.   . losartan (COZAAR) 100 MG tablet Take 100 mg by mouth daily.   Marland Kitchen. SYNTHROID 200 MCG tablet Take 200 mcg by mouth daily before breakfast.    No  facility-administered encounter medications on file as of 08/15/2016.   :  Review of Systems:  Out of a complete 14 point review of systems, all are reviewed and negative with Jill exception of these symptoms as listed below:  Review of Systems  Neurological:       Jill [name] has trouble staying asleep, snores, wakes up feeling tired, headaches, daytime fatigue, takes naps on Jill weekends.    Epworth Sleepiness Scale 0= would never doze 1= slight chance of dozing 2= moderate chance of dozing 3= high chance of dozing  Sitting and reading:3 Watching TV:3 Sitting inactive in a public place (ex. Theater or meeting):2 As a passenger in a car for an hour without a break:3 Lying down to rest in Jill afternoon:3 Sitting and talking to someone: 2 Sitting quietly after lunch (no alcohol):3 In a car, while stopped in traffic:0 Total:19  Objective:  Neurologic Exam  Physical Exam Physical Examination:   Vitals:   08/15/16 1607  BP: 132/78  Pulse: 72  Resp: 16    General Examination: Jill Jill [name] is a very pleasant 46 y.o. female in no acute distress. Jill Jill [name] appears well-developed and well-nourished and very well groomed.   HEENT: Normocephalic, atraumatic, pupils are equal, round and reactive to light and accommodation. Extraocular tracking is good without limitation to gaze excursion or nystagmus noted. Normal smooth pursuit is noted. Hearing is grossly intact. Face is symmetric with normal facial animation and normal facial sensation. Speech is clear with no dysarthria noted. There is no hypophonia. There is no lip, neck/head, jaw or voice tremor. Neck is supple with full range of passive and active motion. There are no carotid bruits on auscultation. Oropharynx exam reveals: mild mouth dryness, good dental hygiene and has retainers in place marked airway crowding, due to smaller airway entry and tonsils of 3+, uvula longer. Mallampati is class II. Tongue protrudes centrally and palate elevates  symmetrically. Neck size is 15 /8 inches. Jill Jill [name] has a Mild overbite. Nasal inspection reveals no significant nasal mucosal bogginess or redness and no septal deviation.   Chest: Clear to auscultation without wheezing, rhonchi or crackles noted.  Heart: S1+S2+0, regular and normal without murmurs, rubs or gallops noted.   Abdomen: Soft, non-tender and non-distended with normal bowel sounds appreciated on auscultation.  Extremities: There is no pitting edema in Jill distal lower extremities bilaterally. Pedal pulses are intact.  Skin: Warm and dry without trophic changes noted. There are no varicose veins distally.  Musculoskeletal: exam reveals no obvious joint deformities, tenderness or joint swelling or erythema.   Neurologically:  Mental status: Jill Jill [name] is awake, alert and oriented in all 4 spheres. Jill Jill [name] immediate and remote memory, attention, language skills and fund of  knowledge are appropriate. There is no evidence of aphasia, agnosia, apraxia or anomia. Speech is clear with normal prosody and enunciation. Thought process is linear. Mood is normal and affect is normal.  Cranial nerves II - XII are as described above under HEENT exam. In addition: shoulder shrug is normal with equal shoulder height noted. Motor exam: Normal bulk, strength and tone is noted. There is no drift, tremor or rebound. Romberg is negative. Reflexes are 2+ throughout. Fine motor skills and coordination: intact with normal finger taps, normal hand movements, normal rapid alternating patting, normal foot taps and normal foot agility.  Cerebellar testing: No dysmetria or intention tremor on finger to nose testing. Heel to shin is unremarkable bilaterally. There is no truncal or gait ataxia.  Sensory exam: intact to light touch in Jill upper and lower extremities.  Gait, station and balance: Jill Jill [name] stands easily. No veering to one side is noted. No leaning to one side is noted. Posture is age-appropriate and stance is narrow  based. Gait shows normal stride length and normal pace. No problems turning are noted. Tandem walk is unremarkable.   Assessment and Plan:  In summary, Jill Jill [name] is a very pleasant 46 y.o.-year old female with an underlying medical history of high platelet count, L carotid artery disease (50-69% L ICA on 06/07/16, not CAD as per chart), hypertension, hyperlipidemia, left upper extremity numbness and tingling, lupus anticoagulant positive, history of PE, thyroid disease, vitamin D deficiency and obesity, whose history and physical exam are concerning for obstructive sleep apnea (OSA). In addition, Jill Jill [name] endorsed RLS type symptoms and leg movements in sleep.  I had a long chat with Jill Jill [name] about my findings and Jill diagnosis of OSA, its prognosis and treatment options. We talked about medical treatments, surgical interventions and non-pharmacological approaches. I explained in particular Jill risks and ramifications of untreated moderate to severe OSA, especially with respect to developing cardiovascular disease down Jill Road, including congestive heart failure, difficult to treat hypertension, cardiac arrhythmias, or stroke. Even type 2 diabetes has, in part, been linked to untreated OSA. Symptoms of untreated OSA include daytime sleepiness, memory problems, mood irritability and mood disorder such as depression and anxiety, lack of energy, as well as recurrent headaches, especially morning headaches. We talked about trying to maintain a healthy lifestyle in general, as well as Jill importance of weight control. I encouraged Jill Jill [name] to eat healthy, exercise daily and keep well hydrated, to keep a scheduled bedtime and wake time routine, to not skip any meals and eat healthy snacks in between meals. I advised Jill Jill [name] not to drive when feeling sleepy. I recommended Jill following at this time: sleep study with potential positive airway pressure titration. (We will score hypopneas at 3%).   I  explained Jill sleep test procedure to Jill Jill [name] and also outlined possible surgical and non-surgical treatment options of OSA, including Jill use of a custom-made dental device (which would require a referral to a specialist dentist or oral surgeon), upper airway surgical options, such as pillar implants, radiofrequency surgery, tongue base surgery, and UPPP (which would involve a referral to an ENT surgeon). Rarely, jaw surgery such as mandibular advancement may be considered.  I also explained Jill CPAP treatment option to Jill Jill [name], who indicated that Jill Jill [name] would be willing to try CPAP if Jill need arises. I explained Jill importance of being compliant with PAP treatment, not only for insurance purposes but primarily to improve Jill Jill [name] symptoms, and for Jill Jill [name]'s long term  health benefit, including to reduce Jill Jill [name] cardiovascular risks. I answered all Jill Jill [name] questions today and Jill Jill [name] was in agreement. I would like to see Jill Jill [name] back after Jill sleep study is completed and encouraged Jill Jill [name] to call with any interim questions, concerns, problems or updates.   Thank you very much for allowing me to participate in Jill care of this nice Jill [name]. If I can be of any further assistance to you please do not hesitate to talk to me.  Sincerely,   Jill Foley, MD, PhD

## 2016-08-17 ENCOUNTER — Encounter: Payer: BLUE CROSS/BLUE SHIELD | Admitting: Neurology

## 2016-08-28 ENCOUNTER — Ambulatory Visit (INDEPENDENT_AMBULATORY_CARE_PROVIDER_SITE_OTHER): Payer: BLUE CROSS/BLUE SHIELD | Admitting: Neurology

## 2016-08-28 DIAGNOSIS — G4733 Obstructive sleep apnea (adult) (pediatric): Secondary | ICD-10-CM

## 2016-08-28 DIAGNOSIS — G4761 Periodic limb movement disorder: Secondary | ICD-10-CM

## 2016-08-28 DIAGNOSIS — G4720 Circadian rhythm sleep disorder, unspecified type: Secondary | ICD-10-CM

## 2016-09-01 NOTE — Progress Notes (Signed)
Patient referred by Dr. Lucia GaskinsAhern, seen by me on 08/15/16, diagnostic PSG on 08/28/16.   Please call and notify the patient that the recent sleep study did not show any significant obstructive sleep apnea: Overall normal AHI and evidence of obstructive sleep apnea while in supine sleep. CPAP therapy is not warranted; avoidance of supine sleep position along with weight loss are recommended. For disturbing snoring, an oral appliance may be considered. Moderate PLMs, with some arousals. Please inform patient that I would like to go over the details of the study during a follow up appointment. Arrange a followup appointment. Also, route or fax report to PCP and referring MD, if other than PCP.  Once you have spoken to patient, you can close this encounter.   Thanks,  Huston FoleySaima Adriel Kessen, MD, PhD [facility])

## 2016-09-01 NOTE — Procedures (Signed)
PATIENT'S NAME:  Jill [name], [name] DOB:      02/19/1971      MR#:    [id number]     DATE OF RECORDING: 08/28/2016 REFERRING M.D.: Dr. Lucia GaskinsAhern, PCP: Georgianne FickAjith Ramachandran, MD Study Performed:   Baseline Polysomnogram HISTORY: 46 year old woman with a history of high platelet count, L carotid artery disease, hypertension, hyperlipidemia, left upper extremity numbness and tingling, lupus anticoagulant positive, history of PE, thyroid disease, vitamin D deficiency and obesity, who reports snoring and excessive daytime somnolence as well as witnessed breathing pauses while asleep per husband's report and morning headaches. The patient endorsed the Epworth Sleepiness Scale at 19/24 points. The patient's weight 205 pounds with a height of 63 (inches), resulting in a BMI of 36.3 kg/m2. The patient's neck circumference measured 15.5 inches.  CURRENT MEDICATIONS: Amlodipine, Aspirin, Atorvastatin, Losartan and Synthroid   PROCEDURE:  This is a multichannel digital polysomnogram utilizing the Somnostar 11.2 system.  Electrodes and sensors were applied and monitored per AASM Specifications.   EEG, EOG, Chin and Limb EMG, were sampled at 200 Hz.  ECG, Snore and Nasal Pressure, Thermal Airflow, Respiratory Effort, CPAP Flow and Pressure, Oximetry was sampled at 50 Hz. Digital video and audio were recorded.      BASELINE STUDY  Lights Out was at 21:20 and Lights On at 05:01.  Total recording time (TRT) was 461.5 minutes, with a total sleep time (TST) of  353.5 minutes.   The patient's sleep latency was 66.5 minutes, which is delayed.  REM latency was 85.5 minutes, which is normal.  The sleep efficiency was 76.6 %.     SLEEP ARCHITECTURE: WASO (Wake after sleep onset) was 41.5 minutes with mild sleep fragmentation noted.  There were 20 minutes in Stage N1, 260.5 minutes Stage N2, 5 minutes Stage N3 and 68 minutes in Stage REM.  The percentage of Stage N1 was 5.7%, Stage N2 was 73.7%, which is increased, Stage N3 was 1.4%,  which is markedly reduced and Stage R (REM sleep) was 19.2%, near normal.  The arousals were noted as: 32 were spontaneous, 70 were associated with PLMs, 16 were associated with respiratory events.    Audio and video analysis did not show any abnormal or unusual movements, behaviors, phonations or vocalizations.  The patient took no bathroom breaks. Mild to moderate and at times loud snoring was noted. The EKG was in keeping with normal sinus rhythm (NSR).  RESPIRATORY ANALYSIS:  There were a total of 16 respiratory events:  0 obstructive apneas, 0 central apneas and 0 mixed apneas with a total of 0 apneas and an apnea index (AI) of 0 /hour. There were 16 hypopneas with a hypopnea index of 2.7 /hour. The patient also had 0 respiratory event related arousals (RERAs).      The total APNEA/HYPOPNEA INDEX (AHI) was 2.7/hour and the total RESPIRATORY DISTURBANCE INDEX was 2.7 /hour.  4 events occurred in REM sleep and 24 events in NREM. The REM AHI was 3.5 /hour, versus a non-REM AHI of 2.5. The patient spent 25.5 minutes of total sleep time in the supine position and 328 minutes in non-supine.. The supine AHI was 28.2 versus a non-supine AHI of 0.7.  OXYGEN SATURATION & C02:  The Wake baseline 02 saturation was 94%, with the lowest being 90%. Time spent below 89% saturation equaled 0 minutes.  PERIODIC LIMB MOVEMENTS: The patient had a total of 160 Periodic Limb Movements.  The Periodic Limb Movement (PLM) index was 27.2 and the PLM Arousal index  was 11.9/hour.    Post-study, the patient indicated that sleep was better than usual.   IMPRESSION:  1. Obstructive Sleep Apnea (OSA), supine 2. Periodic Limb Movement Disorder (PLMD) 3. Dysfunctions associated with sleep stages or arousal from sleep  RECOMMENDATIONS:  1. This study demonstrates an overall normal AHI and evidence of obstructive sleep apnea while in supine sleep. Of note, the absence of supine REM sleep may underestimate her AHI and O2  nadir. CPAP therapy is not warranted; avoidance of supine sleep position along with weight loss are recommended. For disturbing snoring, an oral appliance may be considered.  2. Moderate PLMs (periodic limb movements of sleep) were noted during the study with mild arousals; clinical correlation is recommended.  3. This study shows sleep fragmentation and abnormal sleep stage percentages; these are nonspecific findings and per se do not signify an intrinsic sleep disorder or a cause for the patient's sleep-related symptoms. Causes include (but are not limited to) the first night effect of the sleep study, circadian rhythm disturbances, medication effect or an underlying mood disorder or medical problem.  4. The patient should be cautioned not to drive, work at heights, or operate dangerous or heavy equipment when tired or sleepy. Review and reiteration of good sleep hygiene measures should be pursued with any patient. 5. The patient will be seen in follow-up by Dr. Frances Furbish at [facility] for discussion of the test results and further management strategies. The referring provider will be notified of the test results.   I certify that I have reviewed the entire raw data recording prior to the issuance of this report in accordance with the Standards of Accreditation of the American Academy of Sleep Medicine (AASM)       [name], MD, PhD Diplomat, American Board of Psychiatry and Neurology (Neurology and Sleep Medicine)

## 2016-09-05 ENCOUNTER — Telehealth: Payer: Self-pay

## 2016-09-05 NOTE — Telephone Encounter (Signed)
I spoke to patient and she is aware of results and recommendations. She was able to make f/u appt.

## 2016-09-05 NOTE — Telephone Encounter (Signed)
-----   Message from Huston FoleySaima Athar, MD sent at 09/01/2016 11:57 AM EDT ----- Patient referred by Dr. Lucia GaskinsAhern, seen by me on 08/15/16, diagnostic PSG on 08/28/16.   Please call and notify the patient that the recent sleep study did not show any significant obstructive sleep apnea: Overall normal AHI and evidence of obstructive sleep apnea while in supine sleep. CPAP therapy is not warranted; avoidance of supine sleep position along with weight loss are recommended. For disturbing snoring, an oral appliance may be considered. Moderate PLMs, with some arousals. Please inform patient that I would like to go over the details of the study during a follow up appointment. Arrange a followup appointment. Also, route or fax report to PCP and referring MD, if other than PCP.  Once you have spoken to patient, you can close this encounter.   Thanks,  Huston FoleySaima Athar, MD, PhD [facility])

## 2016-09-21 ENCOUNTER — Ambulatory Visit: Payer: Self-pay | Admitting: Neurology

## 2017-01-11 DIAGNOSIS — Z131 Encounter for screening for diabetes mellitus: Secondary | ICD-10-CM | POA: Diagnosis not present

## 2017-01-11 DIAGNOSIS — Z0000 Encounter for general adult medical examination without abnormal findings: Secondary | ICD-10-CM | POA: Diagnosis not present

## 2017-01-11 DIAGNOSIS — E782 Mixed hyperlipidemia: Secondary | ICD-10-CM | POA: Diagnosis not present

## 2017-01-11 DIAGNOSIS — E039 Hypothyroidism, unspecified: Secondary | ICD-10-CM | POA: Diagnosis not present

## 2017-01-18 DIAGNOSIS — E6609 Other obesity due to excess calories: Secondary | ICD-10-CM | POA: Diagnosis not present

## 2017-01-18 DIAGNOSIS — Z0000 Encounter for general adult medical examination without abnormal findings: Secondary | ICD-10-CM | POA: Diagnosis not present

## 2017-01-18 DIAGNOSIS — E782 Mixed hyperlipidemia: Secondary | ICD-10-CM | POA: Diagnosis not present

## 2017-03-13 DIAGNOSIS — Z01419 Encounter for gynecological examination (general) (routine) without abnormal findings: Secondary | ICD-10-CM | POA: Diagnosis not present

## 2017-03-13 DIAGNOSIS — Z1231 Encounter for screening mammogram for malignant neoplasm of breast: Secondary | ICD-10-CM | POA: Diagnosis not present

## 2017-03-13 DIAGNOSIS — Z124 Encounter for screening for malignant neoplasm of cervix: Secondary | ICD-10-CM | POA: Diagnosis not present

## 2017-03-13 DIAGNOSIS — R87612 Low grade squamous intraepithelial lesion on cytologic smear of cervix (LGSIL): Secondary | ICD-10-CM | POA: Diagnosis not present

## 2017-03-13 DIAGNOSIS — Z6838 Body mass index (BMI) 38.0-38.9, adult: Secondary | ICD-10-CM | POA: Diagnosis not present

## 2017-04-05 IMAGING — US US CAROTID DUPLEX BILAT
1 series · 13 of 24 positions shown · non-contrast
Comparison: Brain MRI - 06/07/2016

CLINICAL DATA: CVA due to distal embolism with right basal ganglia
stroke. History of hyperlipidemia.

EXAM:
BILATERAL CAROTID DUPLEX ULTRASOUND
TECHNIQUE: Gray scale imaging, color Doppler and duplex ultrasound were
performed of bilateral carotid and vertebral arteries in the neck.

[Series 1: us carotid duplex bilat · 0.06mm/px · 13 of 68 slices shown]
[im 1/68]
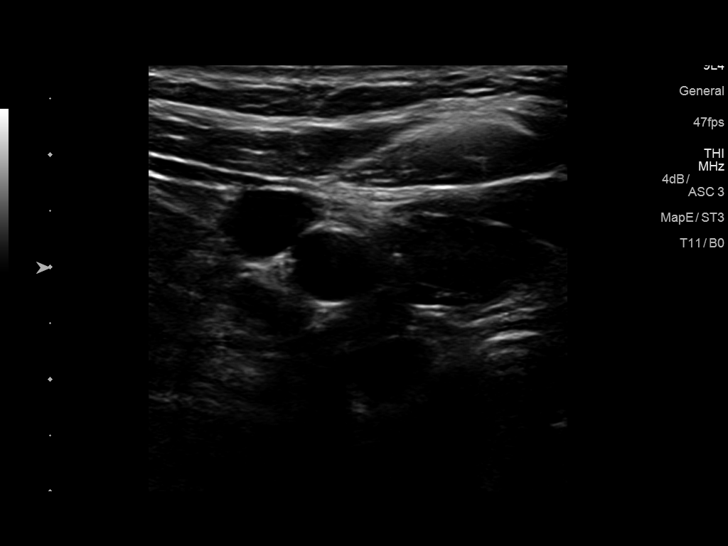
[im 6/68]
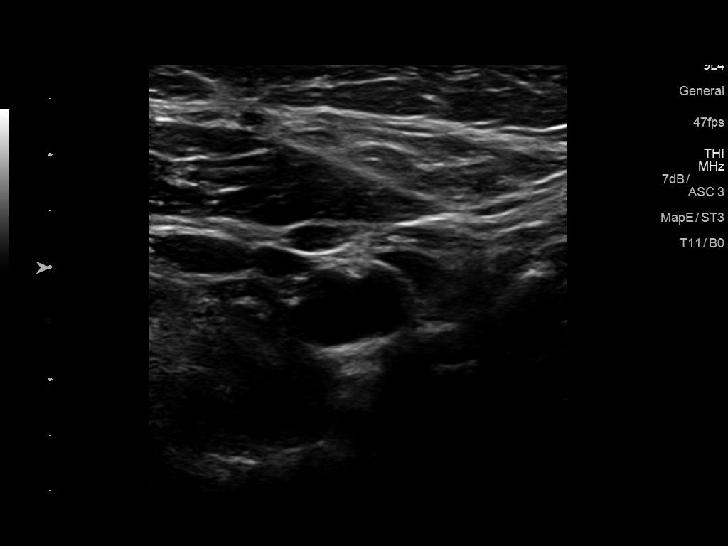
[im 12/68]
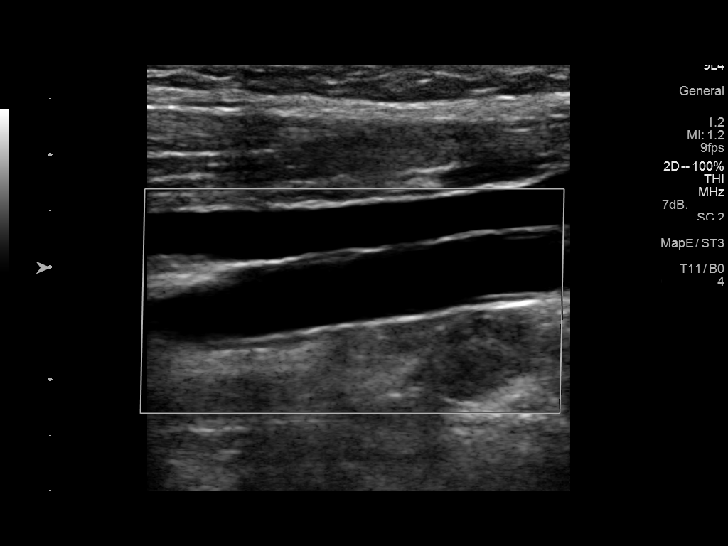
[im 18/68]
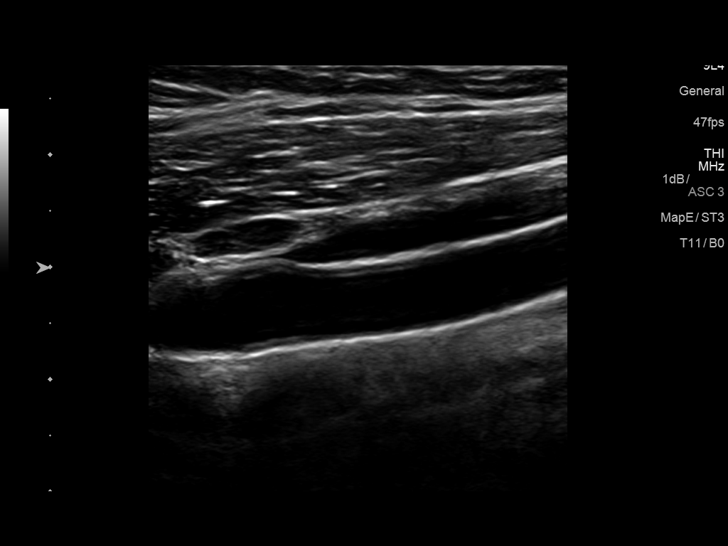
[im 24/68]
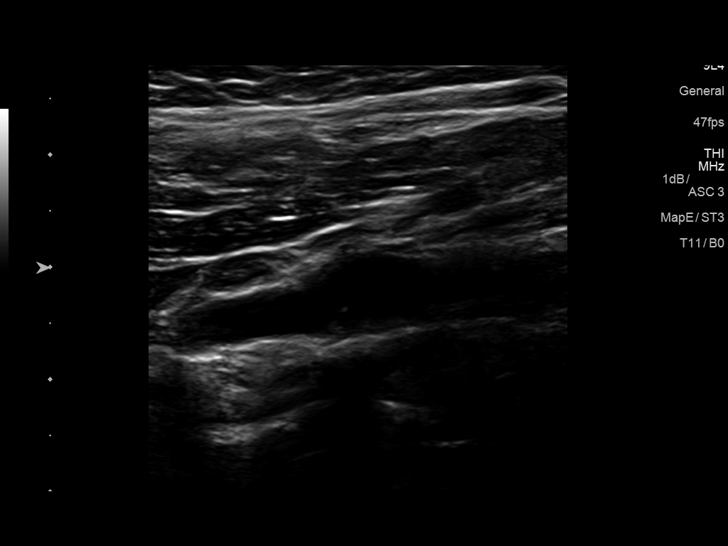
[im 30/68]
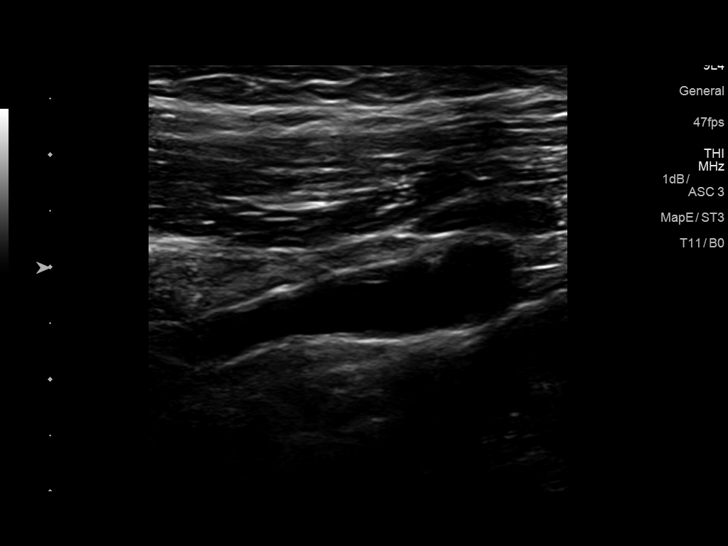
[im 35/68]
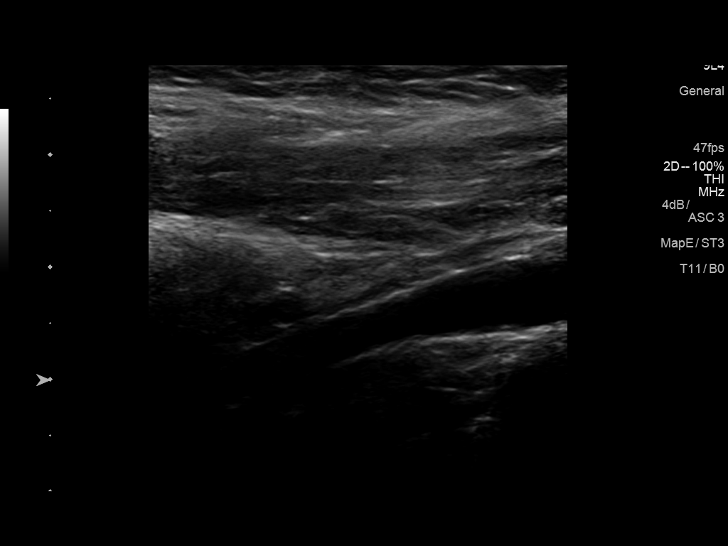
[im 38/68]
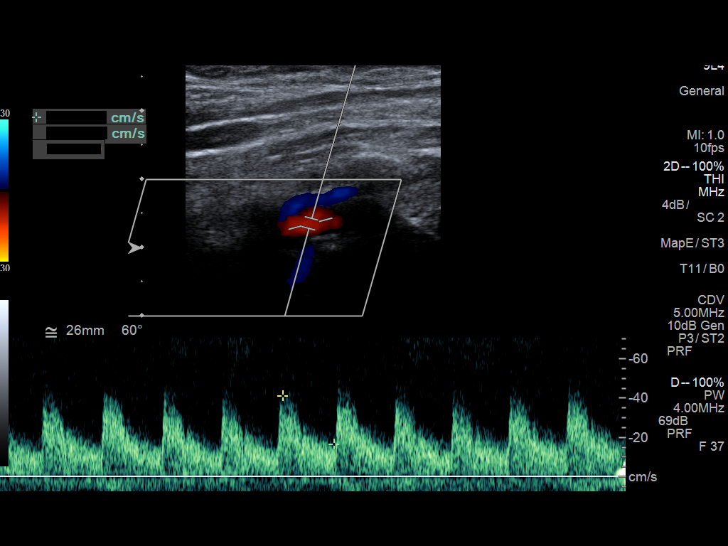
[im 44/68]
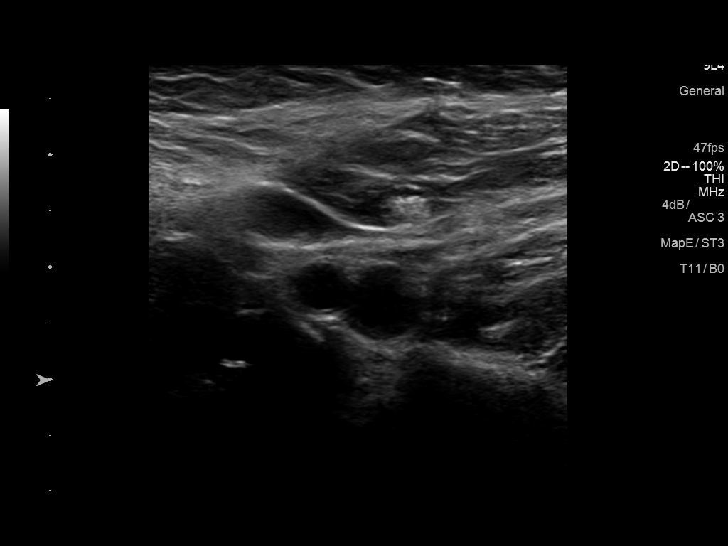
[im 50/68]
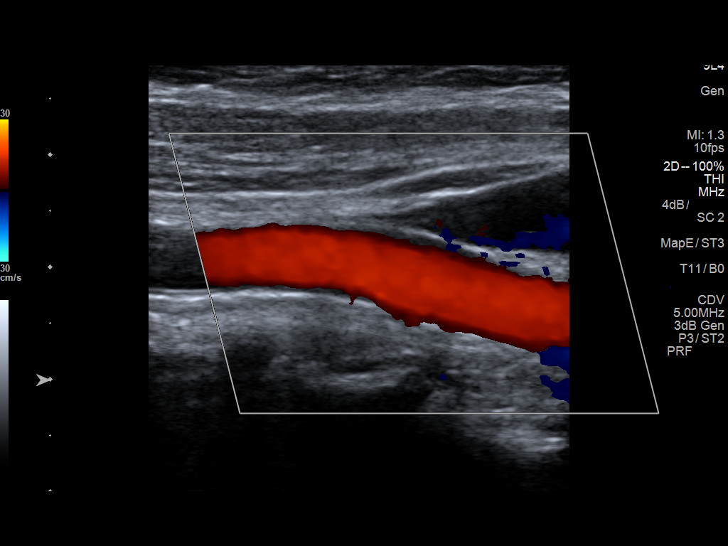
[im 56/68]
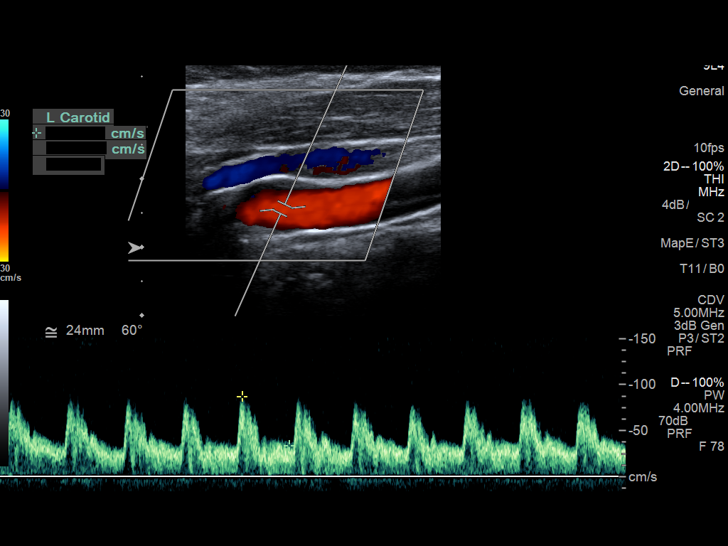
[im 62/68]
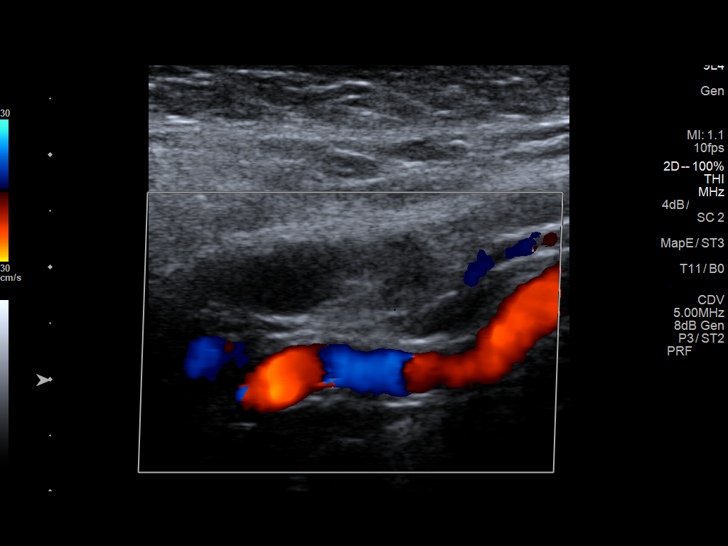
[im 68/68]
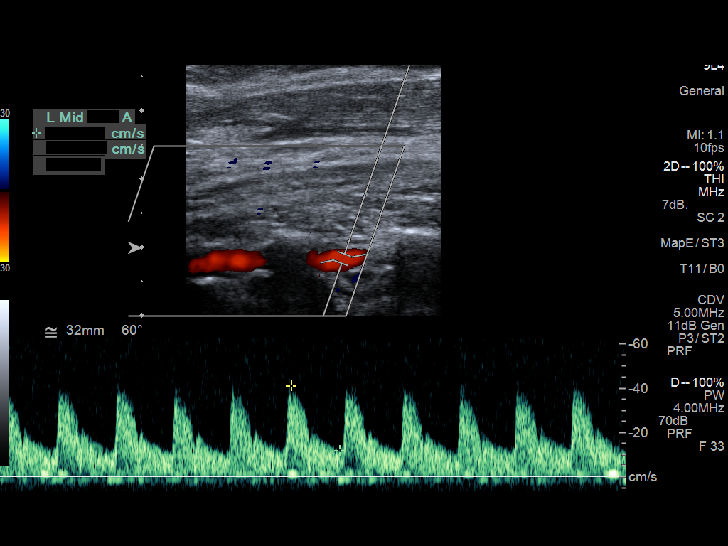

[13 of 24 positions shown; findings below may reference images not displayed]

FINDINGS: Criteria: Quantification of carotid stenosis is based on velocity
parameters that correlate the residual internal carotid diameter
with NASCET-based stenosis levels, using the diameter of the distal
internal carotid lumen as the denominator for stenosis measurement.

The following velocity measurements were obtained:

RIGHT

ICA:  107/15 cm/sec

CCA:  109/33 cm/sec

SYSTOLIC ICA/CCA RATIO:

DIASTOLIC ICA/CCA RATIO:

ECA:  131 cm/sec

LEFT

ICA:  130/59 cm/sec

CCA:  109/25 cm/sec

SYSTOLIC ICA/CCA RATIO:

DIASTOLIC ICA/CCA RATIO:

ECA:  150 cm/sec

RIGHT CAROTID ARTERY: There is a minimal amount of eccentric intimal
wall thickening in hypoechoic plaque involving the origin and
proximal aspect the right internal carotid artery (image 28), not
resulting in elevated peak systolic velocities within the
interrogated course the right internal carotid artery to suggest a
hemodynamically significant stenosis. Borderline elevated peak
systolic velocity within distal aspect the right internal carotid
artery is felt to be factitiously elevated due to sampling at a
location of turbulent flow.

RIGHT VERTEBRAL ARTERY:  Antegrade flow

LEFT CAROTID ARTERY: There is a minimal amount of eccentric
hypoechoic plaque involving the origin and proximal aspects of the
left internal carotid artery (image 61), which results in borderline
elevated peak systolic velocities within the proximal and mid
aspects of the left internal carotid artery (highest peak systolic
velocity within the proximal ICA measures 130 cm/sec - image 64)

LEFT VERTEBRAL ARTERY:  Antegrade Flow
IMPRESSION: 1. Minimal amount of left-sided atherosclerotic plaque results
elevated peak systolic velocities in the left internal carotid
artery compatible with the lower end of the 50 to 69% luminal
narrowing range.
2. Minimal amount of right-sided intimal thickening/atherosclerotic
plaque, not resulting in a hemodynamically significant stenosis.

## 2017-08-14 DIAGNOSIS — M7912 Myalgia of auxiliary muscles, head and neck: Secondary | ICD-10-CM | POA: Diagnosis not present

## 2017-08-16 DIAGNOSIS — I779 Disorder of arteries and arterioles, unspecified: Secondary | ICD-10-CM | POA: Diagnosis not present

## 2017-08-16 DIAGNOSIS — R760 Raised antibody titer: Secondary | ICD-10-CM | POA: Diagnosis not present

## 2017-08-16 DIAGNOSIS — E6609 Other obesity due to excess calories: Secondary | ICD-10-CM | POA: Diagnosis not present

## 2017-08-16 DIAGNOSIS — G500 Trigeminal neuralgia: Secondary | ICD-10-CM | POA: Diagnosis not present

## 2017-08-29 DIAGNOSIS — E782 Mixed hyperlipidemia: Secondary | ICD-10-CM | POA: Diagnosis not present

## 2017-08-29 DIAGNOSIS — I10 Essential (primary) hypertension: Secondary | ICD-10-CM | POA: Diagnosis not present

## 2018-01-16 DIAGNOSIS — N390 Urinary tract infection, site not specified: Secondary | ICD-10-CM | POA: Diagnosis not present

## 2018-01-16 DIAGNOSIS — J029 Acute pharyngitis, unspecified: Secondary | ICD-10-CM | POA: Diagnosis not present

## 2018-01-16 DIAGNOSIS — Z634 Disappearance and death of family member: Secondary | ICD-10-CM | POA: Diagnosis not present

## 2018-01-23 DIAGNOSIS — Z0000 Encounter for general adult medical examination without abnormal findings: Secondary | ICD-10-CM | POA: Diagnosis not present

## 2018-01-23 DIAGNOSIS — I6340 Cerebral infarction due to embolism of unspecified cerebral artery: Secondary | ICD-10-CM | POA: Diagnosis not present

## 2018-01-23 DIAGNOSIS — E782 Mixed hyperlipidemia: Secondary | ICD-10-CM | POA: Diagnosis not present

## 2018-01-23 DIAGNOSIS — E039 Hypothyroidism, unspecified: Secondary | ICD-10-CM | POA: Diagnosis not present

## 2018-01-23 DIAGNOSIS — I10 Essential (primary) hypertension: Secondary | ICD-10-CM | POA: Diagnosis not present

## 2018-01-31 DIAGNOSIS — Z0000 Encounter for general adult medical examination without abnormal findings: Secondary | ICD-10-CM | POA: Diagnosis not present

## 2018-01-31 DIAGNOSIS — E782 Mixed hyperlipidemia: Secondary | ICD-10-CM | POA: Diagnosis not present

## 2018-01-31 DIAGNOSIS — E6609 Other obesity due to excess calories: Secondary | ICD-10-CM | POA: Diagnosis not present

## 2018-01-31 DIAGNOSIS — I872 Venous insufficiency (chronic) (peripheral): Secondary | ICD-10-CM | POA: Diagnosis not present

## 2018-01-31 DIAGNOSIS — I779 Disorder of arteries and arterioles, unspecified: Secondary | ICD-10-CM | POA: Diagnosis not present

## 2018-02-02 ENCOUNTER — Other Ambulatory Visit: Payer: Self-pay | Admitting: Internal Medicine

## 2018-02-02 DIAGNOSIS — I779 Disorder of arteries and arterioles, unspecified: Secondary | ICD-10-CM

## 2018-02-02 DIAGNOSIS — I739 Peripheral vascular disease, unspecified: Principal | ICD-10-CM

## 2018-02-04 ENCOUNTER — Other Ambulatory Visit: Payer: Self-pay | Admitting: Internal Medicine

## 2018-02-04 DIAGNOSIS — I872 Venous insufficiency (chronic) (peripheral): Secondary | ICD-10-CM

## 2018-02-07 ENCOUNTER — Ambulatory Visit
Admission: RE | Admit: 2018-02-07 | Discharge: 2018-02-07 | Disposition: A | Discharge status: Home / Self Care | Payer: BLUE CROSS/BLUE SHIELD | Source: Ambulatory Visit | Attending: Internal Medicine | Admitting: Internal Medicine

## 2018-02-07 DIAGNOSIS — I872 Venous insufficiency (chronic) (peripheral): Secondary | ICD-10-CM

## 2018-02-07 DIAGNOSIS — I739 Peripheral vascular disease, unspecified: Principal | ICD-10-CM

## 2018-02-07 DIAGNOSIS — R600 Localized edema: Secondary | ICD-10-CM | POA: Diagnosis not present

## 2018-02-07 DIAGNOSIS — I779 Disorder of arteries and arterioles, unspecified: Secondary | ICD-10-CM

## 2018-02-07 NOTE — Consult Note (Signed)
Chief Complaint:  Lower extremity pain and edema, concern for venous insufficiency.  History of previous pulmonary embolus.   Referring Physician(s): Ramachandran,Ajith  History of Present Illness: Jill [name] is a 47 y.o. female who presents for lower extremity venous evaluation.  She has a prior history of hyperlipidemia, hypertension, essential thrombocythemia, obesity, and lupus anticoagulant positive.  She has a remote history of pulmonary embolus which required anticoagulation.  She now remains on a baby aspirin.  She presents for evaluation of her lower legs for edema and chronic pain.  No prior history of venous treatments including laser therapy or injections.  No history of chronic venous disease, ulcerations or recent DVT.  She currently is not on any hormone replacement therapy or birth control.  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  She has a relatively sedentary lifestyle.  She does not take any pain medications and elevation does not significantly improve her lower external he symptoms.  She reports bilateral lower extremity edema, pain, fatigue and aching sensation.  These are her main complaints.  Past Medical History:  Diagnosis Date  . Coronary artery disease    Left-sided  . Hyperlipidemia   . Hypertension   . Lupus anticoagulant positive   . Obesity   . Pulmonary embolism (HCC)   . Thrombocytopenia (HCC)   . Thyroid disease   . Vitamin D deficiency     Past Surgical History:  Procedure Laterality Date  . NOVASURE ABLATION  2016    Allergies: Patient has no known allergies.  Medications: Prior to Admission medications   Medication Sig Start Date End Date Taking? Authorizing Provider  amLODipine (NORVASC) 5 MG tablet Take 5 mg by mouth daily.  06/27/16  Yes [provider]  aspirin EC 81 MG tablet Take 81 mg by mouth daily.   Yes [provider]  atorvastatin  (LIPITOR) 40 MG tablet Take 40 mg by mouth daily at 6 PM.  07/10/16  Yes [provider]  losartan (COZAAR) 100 MG tablet Take 100 mg by mouth daily.  07/15/16  Yes [provider]  SYNTHROID 200 MCG tablet Take 200 mcg by mouth daily before breakfast.  05/29/16  Yes [provider]     Family History  Problem Relation Age of Onset  . Hypertension Mother   . Breast cancer Mother   . Skin cancer Father   . Stroke Father   . Heart attack Father   . Hypertension Father   . High Cholesterol Father   . Neuropathy Neg Hx     Social History   Socioeconomic History  . Marital status: Married    Spouse name: Not on file  . Number of children: 3  . Years of education: 75  . Highest education level: Not on file  Occupational History  . Occupation: Ship broker  Social Needs  . Financial resource strain: Not on file  . Food insecurity:    Worry: Not on file    Inability: Not on file  . Transportation needs:    Medical: Not on file    Non-medical: Not on file  Tobacco Use  . Smoking status: Never Smoker  . Smokeless tobacco: Never Used  Substance and Sexual Activity  . Alcohol use: Yes    Comment: Rare  . Drug use: No  . Sexual activity: Not on file    Comment: Married  Lifestyle  . Physical activity:  Days per week: Not on file    Minutes per session: Not on file  . Stress: Not on file  Relationships  . Social connections:    Talks on phone: Not on file    Gets together: Not on file    Attends religious service: Not on file    Active member of club or organization: Not on file    Attends meetings of clubs or organizations: Not on file    Relationship status: Not on file  Other Topics Concern  . Not on file  Social History Narrative   Lives at home w/ spouse and 3 children, has 1 step-son, 2 dogs   Right-handed   Caffeine: 2 glasses of tea per day    Review of Systems: A 12 point ROS discussed and pertinent positives are indicated in the HPI  above.  All other systems are negative.  Review of Systems  Vital Signs: BP 140/70   Pulse 84   Temp 98.2 F (36.8 C) (Oral)   Resp 14   Ht 5\' 3"  (1.6 m)   Wt 95.3 kg   SpO2 97%   BMI 37.20 kg/m   Physical Exam  Constitutional: She appears well-developed and well-nourished. No distress.  Moderate obese body habitus.  Eyes: Conjunctivae are normal. No scleral icterus.  Musculoskeletal: Normal range of motion. She exhibits no edema, tenderness or deformity.  Truncal and lower extremity obesity.  No significant peripheral edema.  Normal pedal pulses.  No chronic venous disease including hyperpigmentation, discoloration, varicosities, or significant spider veins.  No eczema or erythema.  Skin intact.  Skin: Skin is dry. No rash noted. She is not diaphoretic. No erythema.     Imaging: US Venous Img Lower Bilateral  Result Date: 02/07/2018 CLINICAL DATA:  Lower extremity edema, pain, concern for venous insufficiency EXAM: BILATERAL LOWER EXTREMITY VENOUS DOPPLER ULTRASOUND TECHNIQUE: Gray-scale sonography with graded compression, as well as color Doppler and duplex ultrasound were performed to evaluate the lower extremity deep venous systems from the level of the common femoral vein and including the common femoral, femoral, profunda femoral, popliteal and calf veins including the posterior tibial, peroneal and gastrocnemius veins when visible. The superficial great saphenous vein was also interrogated. Spectral Doppler was utilized to evaluate flow at rest and with distal augmentation maneuvers in the common femoral, femoral and popliteal veins. COMPARISON:  None. FINDINGS: RIGHT LOWER EXTREMITY Common Femoral Vein: No evidence of thrombus. Normal compressibility, respiratory phasicity and response to augmentation. Saphenofemoral Junction: No evidence of thrombus. Normal compressibility and flow on color Doppler imaging. Negative for reflux or venous insufficiency. Profunda Femoral Vein: No  evidence of thrombus. Normal compressibility and flow on color Doppler imaging. Femoral Vein: No evidence of thrombus. Normal compressibility, respiratory phasicity and response to augmentation. Popliteal Vein: No evidence of thrombus. Normal compressibility, respiratory phasicity and response to augmentation. Calf Veins: No evidence of thrombus. Normal compressibility and flow on color Doppler imaging. Superficial Great Saphenous Vein: No evidence of thrombus. Normal compressibility. Negative for reflux or venous insufficiency. Small saphenous vein: Negative for thrombus. Normal compressibility. Negative for reflux or venous insufficiency Venous Reflux:  None. Other Findings:  None. LEFT LOWER EXTREMITY Common Femoral Vein: No evidence of thrombus. Normal compressibility, respiratory phasicity and response to augmentation. Saphenofemoral Junction: No evidence of thrombus. Normal compressibility and flow on color Doppler imaging. Negative for reflux or venous insufficiency. Profunda Femoral Vein: No evidence of thrombus. Normal compressibility and flow on color Doppler imaging. Femoral Vein: No evidence of thrombus.  Normal compressibility, respiratory phasicity and response to augmentation. Popliteal Vein: No evidence of thrombus. Normal compressibility, respiratory phasicity and response to augmentation. Calf Veins: No evidence of thrombus. Normal compressibility and flow on color Doppler imaging. Superficial Great Saphenous Vein: No evidence of thrombus. Normal compressibility. Negative for significant reflux or venous insufficiency. Left distal GSV bifurcates across the knee but reconnect in the calf region. One of the bifurcated segments as very minor venous insufficiency/reflux without any significant branching varicosities developing. Small saphenous vein: Negative for reflux or venous insufficiency Venous Reflux:  None. Other Findings:  None. IMPRESSION: Negative for DVT in either extremity. No significant  superficial saphenous reflux that warrants intervention. Electronically Signed   By: Judie Petit.  Stephnie Parlier M.D.   On: 02/07/2018 09:39   [place] Rad Eval And Mgmt  Result Date: 02/07/2018 Please refer to "Notes" to see consult details.   Labs:  CBC: No results for input(s): WBC, HGB, HCT, PLT in the last 8760 hours.  COAGS: No results for input(s): INR, APTT in the last 8760 hours.  BMP: No results for input(s): NA, K, CL, CO2, GLUCOSE, BUN, CALCIUM, CREATININE, GFRNONAA, GFRAA in the last 8760 hours.  Invalid input(s): CMP  LIVER FUNCTION TESTS: No results for input(s): BILITOT, AST, ALT, ALKPHOS, PROT, ALBUMIN in the last 8760 hours.   Assessment and Plan:  No significant lower extremity venous insufficiency or reflux that warrants intervention by diagnostic ultrasound.  She does have minor left distal GSV venous insufficiency across the knee over a short segment but no developing subsurface branching varicosities in this region.  Lower extremity perceived edema, chronic fatigue and aching likely is multifactorial related to sedentary lifestyle and obesity.  Plan: If any visible varicosities develop she can be reevaluated as needed.  Currently no venous treatment is indicated.  Thank you for this interesting consult.  I greatly enjoyed meeting WINNIE UMALI and look forward to participating in their care.  A copy of this report was sent to the requesting provider on this date.  Electronically Signed: Berdine Dance 02/07/2018, 9:46 AM   I spent a total of  40 Minutes   in face to face in clinical consultation, greater than 50% of which was counseling/coordinating care for with lower extreme knee pain and edema.

## 2018-12-02 ENCOUNTER — Other Ambulatory Visit: Payer: Self-pay

## 2018-12-02 ENCOUNTER — Ambulatory Visit (INDEPENDENT_AMBULATORY_CARE_PROVIDER_SITE_OTHER): Payer: 59 | Admitting: Cardiology

## 2018-12-02 ENCOUNTER — Encounter: Payer: Self-pay | Admitting: Cardiology

## 2018-12-02 VITALS — BP 132/86 | HR 79 | Ht 63.0 in | Wt 224.0 lb

## 2018-12-02 DIAGNOSIS — R072 Precordial pain: Secondary | ICD-10-CM

## 2018-12-02 DIAGNOSIS — E039 Hypothyroidism, unspecified: Secondary | ICD-10-CM

## 2018-12-02 DIAGNOSIS — I10 Essential (primary) hypertension: Secondary | ICD-10-CM

## 2018-12-02 DIAGNOSIS — R079 Chest pain, unspecified: Secondary | ICD-10-CM

## 2018-12-02 DIAGNOSIS — E785 Hyperlipidemia, unspecified: Secondary | ICD-10-CM

## 2018-12-02 HISTORY — DX: Hyperlipidemia, unspecified: E78.5

## 2018-12-02 HISTORY — DX: Essential (primary) hypertension: I10

## 2018-12-02 HISTORY — DX: Hypothyroidism, unspecified: E03.9

## 2018-12-02 NOTE — Progress Notes (Signed)
Patient referred by Merrilee Seashore, MD for chest pain  Subjective:   Jill [name], female    DOB: [birth year], 48 y.o.   MRN: [id number]   Chief Complaint  Patient presents with  . Chest Pain  . New Patient (Initial Visit)     HPI  48 y.o. Caucasian female with hypertension, hyperlipidemia, hypothyroidism, h/o provoked PE in 2004, stable thrombocytosis, referred for evaluation of chest pain.   Patient has been experiencing left-sided sharp chest pain and last for few seconds, unrelated to exertion.  Separately, she feels central chest tightness which is fairly constant, also not aggravated with physical exertion.  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  She walks very occasionally without any significant chest pain. Patient had undergone an exercise treadmill stress test 5 years ago, which was reportedly normal.  Patient has previously had work-up with brain imaging.  While CT head in 2018 was suspicious for basal ganglia stroke, MRI confirmed the blood was thought to be a stroke on CT head, was in fact perivascular space and a normal variant.  She has had carotid ultrasound.  While patient tells me that she has 70 to 80% narrowing, reports suggest that she has moderate plaque without any significant stenosis. She is currently on aspirin and statin.  Past Medical History:  Diagnosis Date  . Coronary artery disease    Left-sided  . Hyperlipidemia   . Hypertension   . Lupus anticoagulant positive   . Obesity   . Pulmonary embolism ([place])   . Thrombocytopenia ([place])   . Thyroid disease   . Vitamin D deficiency      Past Surgical History:  Procedure Laterality Date  . NOVASURE ABLATION  2016     Social History   Socioeconomic History  . Marital status: Married    Spouse name: Not on file  . Number of children: 3  . Years of education: 52  . Highest education level: Not on file  Occupational History  . Occupation: Sports coach  Social Needs   . Financial resource strain: Not on file  . Food insecurity    Worry: Not on file    Inability: Not on file  . Transportation needs    Medical: Not on file    Non-medical: Not on file  Tobacco Use  . Smoking status: Never Smoker  . Smokeless tobacco: Never Used  Substance and Sexual Activity  . Alcohol use: Yes    Comment: Rare  . Drug use: No  . Sexual activity: Not on file    Comment: Married  Lifestyle  . Physical activity    Days per week: Not on file    Minutes per session: Not on file  . Stress: Not on file  Relationships  . Social Herbalist on phone: Not on file    Gets together: Not on file    Attends religious service: Not on file    Active member of club or organization: Not on file    Attends meetings of clubs or organizations: Not on file    Relationship status: Not on file  . Intimate partner violence    Fear of current or ex partner: Not on file    Emotionally abused: Not on file    Physically abused: Not on file    Forced sexual activity: Not on file  Other Topics Concern  . Not on file  Social History Narrative   Lives at home  w/ spouse and 3 children, has 1 step-son, 2 dogs   Right-handed   Caffeine: 2 glasses of tea per day     Family History  Problem Relation Age of Onset  . Hypertension Mother   . Breast cancer Mother   . Skin cancer Father   . Stroke Father   . Heart attack Father   . Hypertension Father   . High Cholesterol Father   . Neuropathy Neg Hx      Current Outpatient Medications on File Prior to Visit  Medication Sig Dispense Refill  . amLODipine (NORVASC) 5 MG tablet Take 5 mg by mouth daily.     Marland Kitchen aspirin EC 81 MG tablet Take 81 mg by mouth daily.    Marland Kitchen atorvastatin (LIPITOR) 40 MG tablet Take 40 mg by mouth daily at 6 PM.     . losartan (COZAAR) 100 MG tablet Take 100 mg by mouth daily.     Marland Kitchen SYNTHROID 200 MCG tablet Take 200 mcg by mouth daily before breakfast.      No current facility-administered  medications on file prior to visit.     Cardiovascular studies:  Carotid duplex 02/07/2018: Color duplex indicates moderate homogeneous plaque, with no hemodynamically significant stenosis by duplex criteria in the extracranial cerebrovascular circulation.  EKG 12/02/2018: Sinus rhythm 76 bpm. Normal EKG.  MRI brain 06/07/2016: Stable and normal noncontrast MRI appearance of the brain. No explanation for headache or dizziness. This small right basal ganglia finding on the recent CT is a perivascular space (normal variant).    Recent labs: 06/21/2018: Glucose 82. BUN/Cr 11/0.68. eGFR normal. Na/K 138/4.5. AlKP 146 (39-117). Rest of the CMP normal.  H/H 14/42. MCV 82. Platelets 511. Chol 189, TG 119, HDL 53, LDL 112.    Review of Systems  Constitution: Negative for decreased appetite, malaise/fatigue, weight gain and weight loss.  HENT: Negative for congestion.   Eyes: Negative for visual disturbance.  Cardiovascular: Positive for chest pain and dyspnea on exertion. Negative for leg swelling, palpitations and syncope.  Respiratory: Positive for shortness of breath. Negative for cough.   Endocrine: Negative for cold intolerance.  Hematologic/Lymphatic: Does not bruise/bleed easily.  Skin: Negative for itching and rash.  Musculoskeletal: Negative for myalgias.  Gastrointestinal: Negative for abdominal pain, nausea and vomiting.  Genitourinary: Negative for dysuria.  Neurological: Negative for dizziness and weakness.  Psychiatric/Behavioral: The patient is not nervous/anxious.   All other systems reviewed and are negative.        Vitals:   12/02/18 1251  BP: 132/86  Pulse: 79  SpO2: 97%     Body mass index is 39.68 kg/m. Filed Weights   12/02/18 1251  Weight: 224 lb (101.6 kg)     Objective:   Physical Exam  Constitutional: She is oriented to person, place, and time. She appears well-developed and well-nourished. No distress.  Moderate obesity    HENT:  Head: Normocephalic and atraumatic.  Eyes: Pupils are equal, round, and reactive to light. Conjunctivae are normal.  Neck: No JVD present.  Cardiovascular: Normal rate, regular rhythm and intact distal pulses.  Pulmonary/Chest: Effort normal and breath sounds normal. She has no wheezes. She has no rales.  Abdominal: Soft. Bowel sounds are normal. There is no rebound.  Musculoskeletal:        General: No edema.  Lymphadenopathy:    She has no cervical adenopathy.  Neurological: She is alert and oriented to person, place, and time. No cranial nerve deficit.  Skin: Skin is warm and  dry.  Psychiatric: She has a normal mood and affect.  Nursing note and vitals reviewed.         Assessment & Recommendations:   48 y.o. Caucasian female with hypertension, hyperlipidemia, hypothyroidism, h/o provoked PE in 2004, stable thrombocytosis, referred for evaluation of chest pain.   Chest pain: Low risk for angina.  With normal physical exam, shortness of breath most likely due to deconditioning and obesity.  Contrary to patient's understanding, she does not have obstructive carotid artery disease, rather has moderate plaque without any significant stenosis.  She has had normal exercise treadmill stress test in the past but continues to have chest pain.  She will benefit from CT angiogram for definitive coronary anatomy evaluation.  If BMI is considered for limiting factor to obtain adequate visualization of her coronary arteries, then perform exercise nuclear stress test.  If coronary CTA normal, aspirin can be stopped.  Continue statin.  Thank you for referring the patient to [place]. Please feel free to contact with any questions.  Nigel Mormon, MD [facility] [place] Pager: [phone] Office: [phone] If no answer Cell [phone]

## 2018-12-06 IMAGING — US US EXTREM LOW VENOUS BILAT
1 series · 12 of 24 positions shown · non-contrast
Comparison: None.

CLINICAL DATA: Lower extremity edema, pain, concern for venous
insufficiency



[Series 1: us extrem low venous bilat · 0.09mm/px · 12 of 46 slices shown]
[im 2/46]
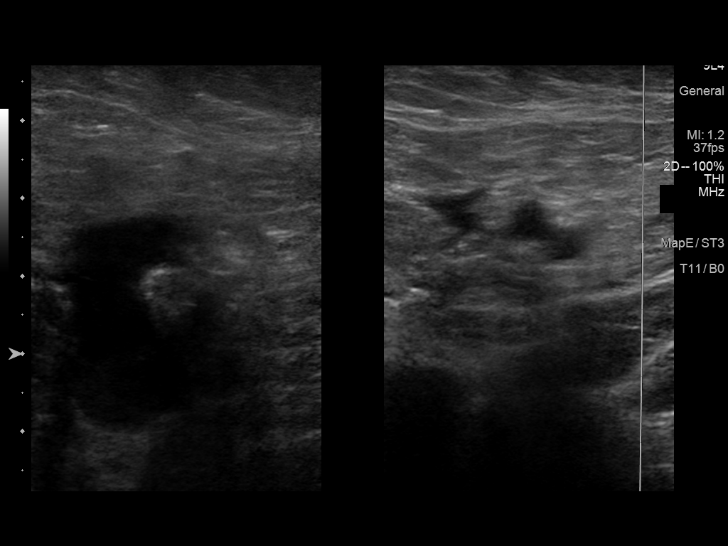
[im 6/46]
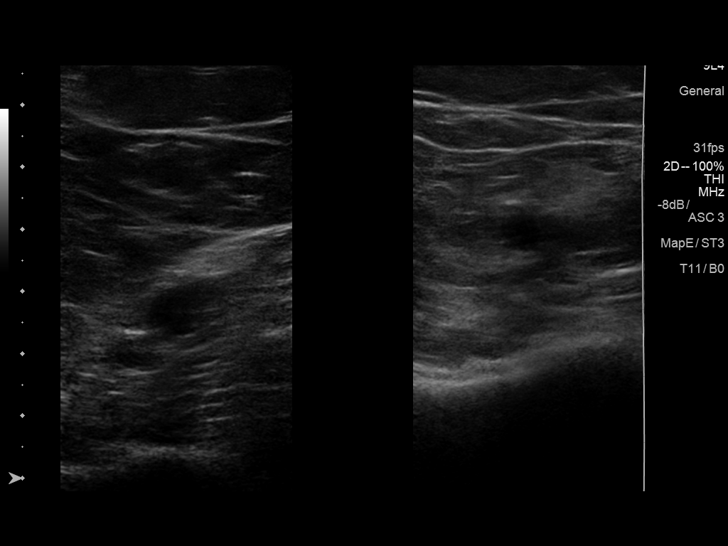
[im 10/46]
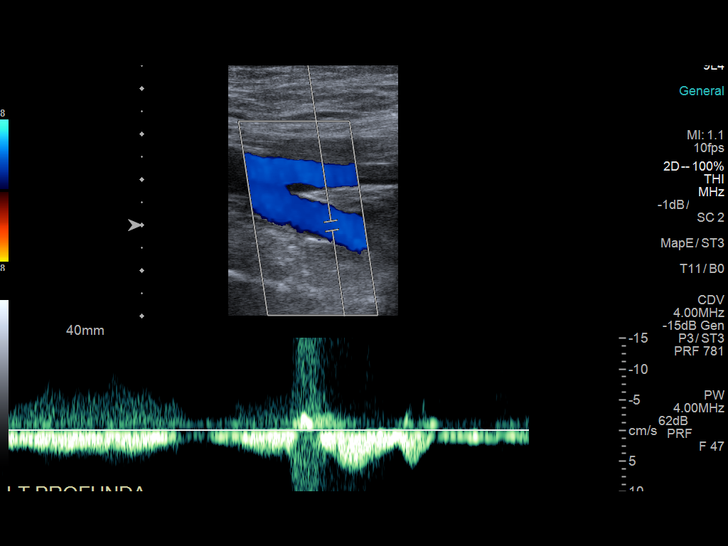
[im 14/46]
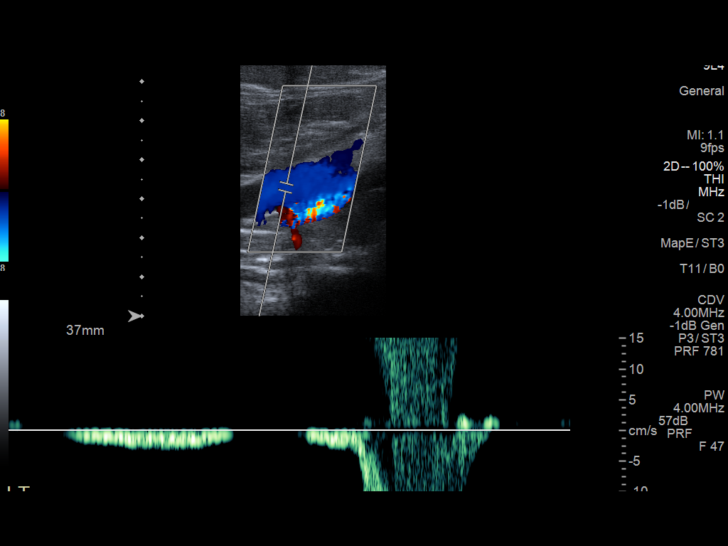
[im 18/46]
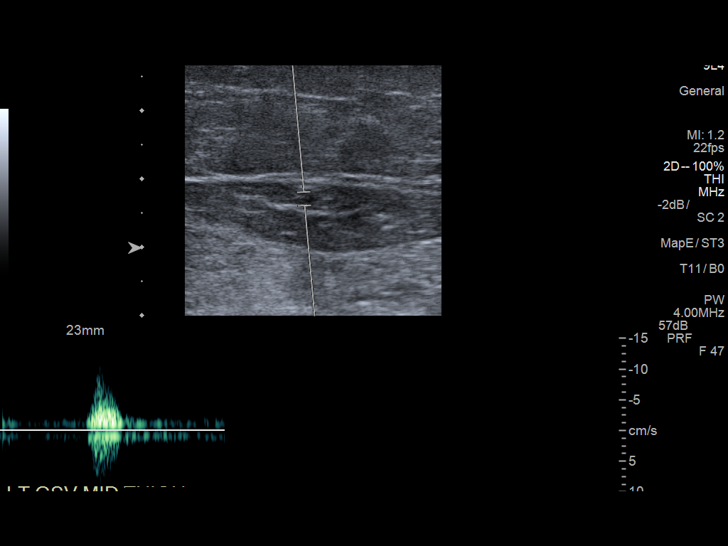
[im 22/46]
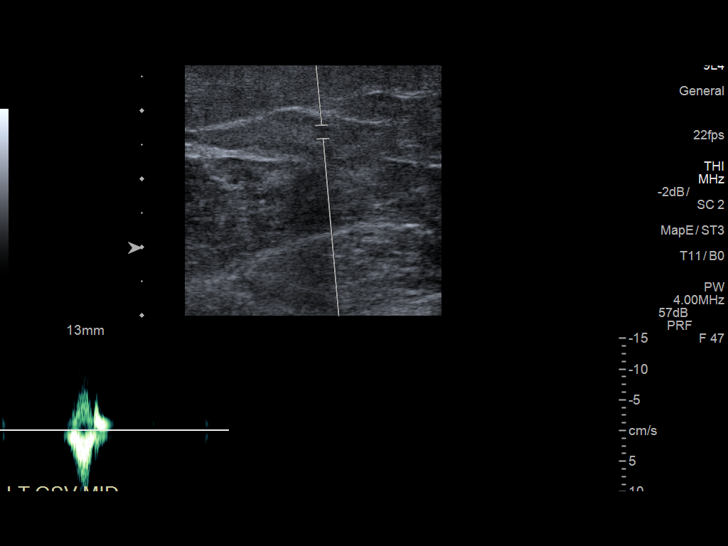
[im 26/46]
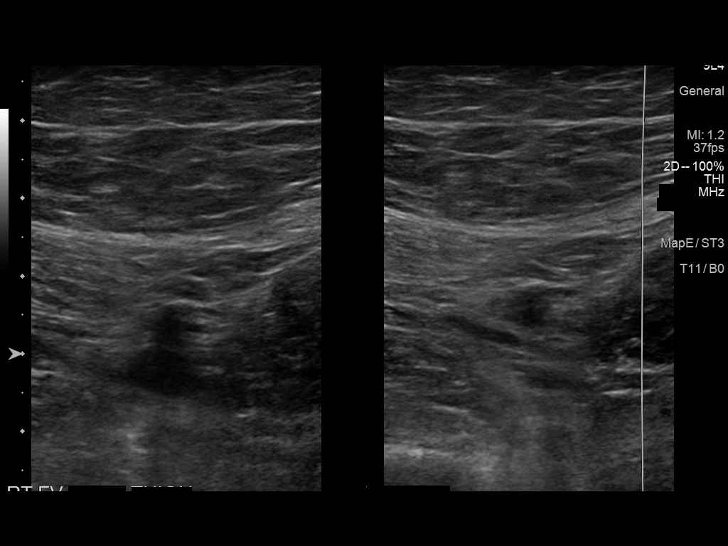
[im 30/46]
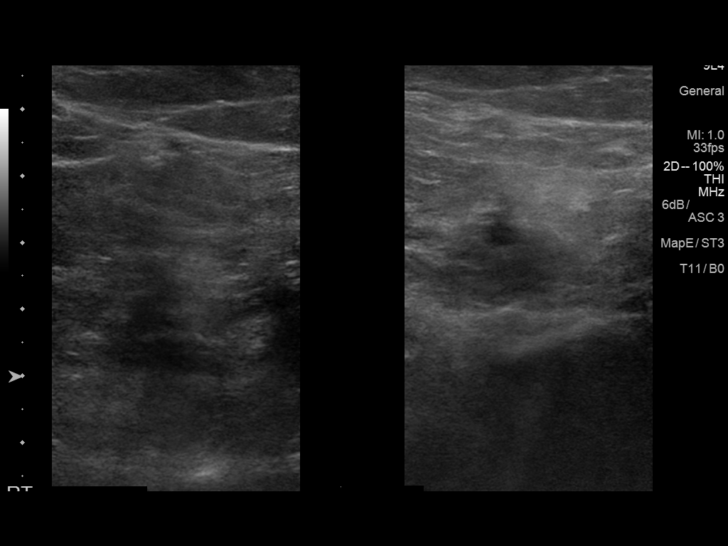
[im 34/46]
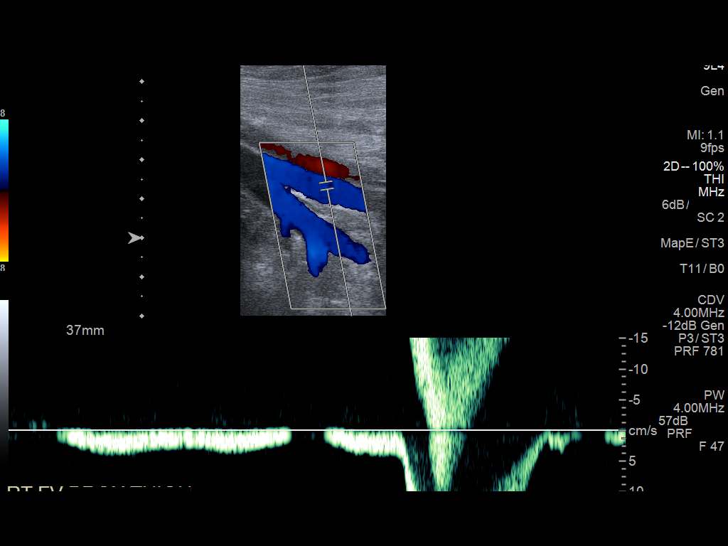
[im 38/46]
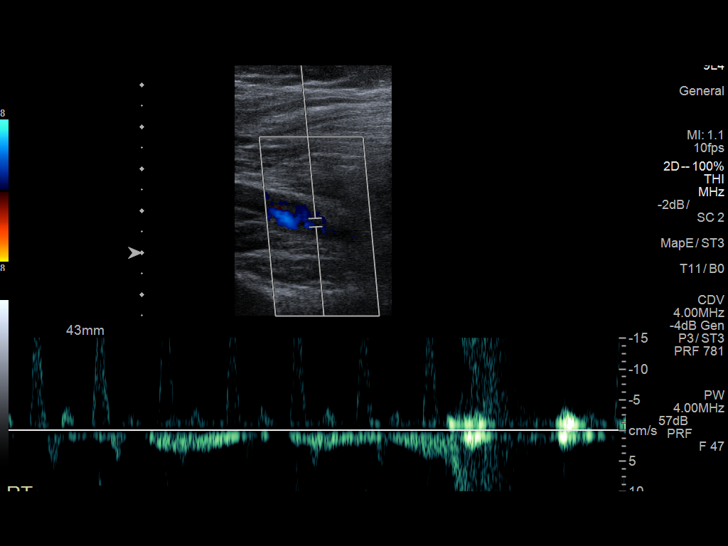
[im 42/46]
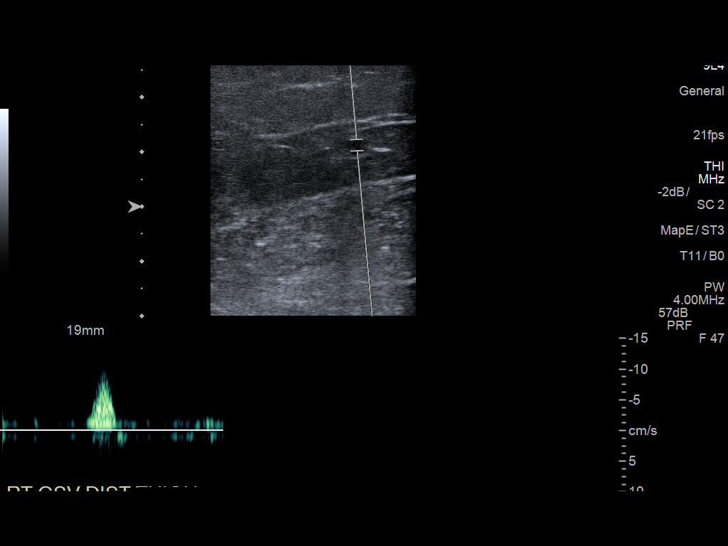
[im 46/46]
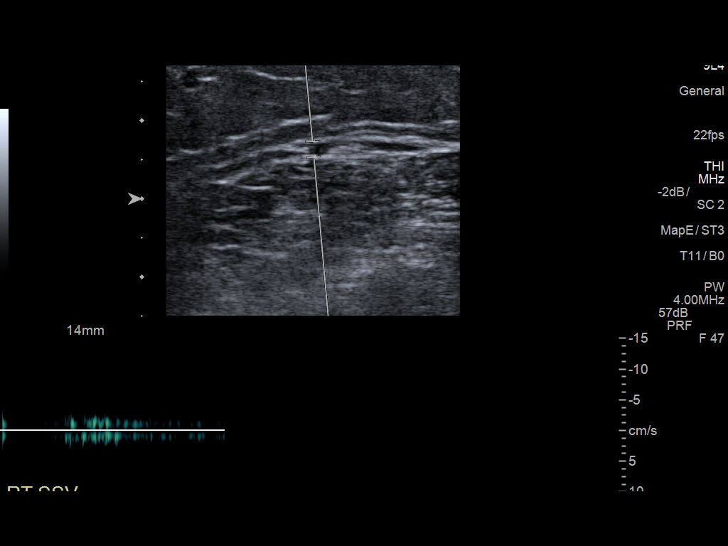

[12 of 24 positions shown; findings below may reference images not displayed]

FINDINGS: RIGHT LOWER EXTREMITY

Common Femoral Vein: No evidence of thrombus. Normal
compressibility, respiratory phasicity and response to augmentation.

Saphenofemoral Junction: No evidence of thrombus. Normal
compressibility and flow on color Doppler imaging. Negative for
reflux or venous insufficiency.

Profunda Femoral Vein: No evidence of thrombus. Normal
compressibility and flow on color Doppler imaging.

Femoral Vein: No evidence of thrombus. Normal compressibility,
respiratory phasicity and response to augmentation.

Popliteal Vein: No evidence of thrombus. Normal compressibility,
respiratory phasicity and response to augmentation.

Calf Veins: No evidence of thrombus. Normal compressibility and flow
on color Doppler imaging.

Superficial Great Saphenous Vein: No evidence of thrombus. Normal
compressibility. Negative for reflux or venous insufficiency.

Small saphenous vein: Negative for thrombus. Normal compressibility.
Negative for reflux or venous insufficiency

Venous Reflux:  None.

Other Findings:  None.

LEFT LOWER EXTREMITY

Common Femoral Vein: No evidence of thrombus. Normal
compressibility, respiratory phasicity and response to augmentation.

Saphenofemoral Junction: No evidence of thrombus. Normal
compressibility and flow on color Doppler imaging. Negative for
reflux or venous insufficiency.

Profunda Femoral Vein: No evidence of thrombus. Normal
compressibility and flow on color Doppler imaging.

Femoral Vein: No evidence of thrombus. Normal compressibility,
respiratory phasicity and response to augmentation.

Popliteal Vein: No evidence of thrombus. Normal compressibility,
respiratory phasicity and response to augmentation.

Calf Veins: No evidence of thrombus. Normal compressibility and flow
on color Doppler imaging.

Superficial Great Saphenous Vein: No evidence of thrombus. Normal
compressibility. Negative for significant reflux or venous
insufficiency. Left distal GSV bifurcates across the knee but
reconnect in the calf region. One of the bifurcated segments as very
minor venous insufficiency/reflux without any significant branching
varicosities developing.

Small saphenous vein: Negative for reflux or venous insufficiency

Venous Reflux:  None.

Other Findings:  None.
IMPRESSION: Negative for DVT in either extremity.

No significant superficial saphenous reflux that warrants
intervention.

## 2019-01-07 ENCOUNTER — Other Ambulatory Visit: Payer: Self-pay | Admitting: Cardiology

## 2019-01-07 ENCOUNTER — Telehealth (HOSPITAL_COMMUNITY): Payer: Self-pay | Admitting: Emergency Medicine

## 2019-01-07 DIAGNOSIS — R072 Precordial pain: Secondary | ICD-10-CM

## 2019-01-07 MED ORDER — METOPROLOL TARTRATE 25 MG PO TABS: 25.0000 mg | ORAL_TABLET | Freq: Two times a day (BID) | ORAL | 0 refills | Status: AC

## 2019-01-07 NOTE — Telephone Encounter (Signed)
Reaching out to patient to offer assistance regarding upcoming cardiac imaging study; pt verbalizes understanding of appt date/time, parking situation and where to check in, pre-test NPO status and medications ordered, and verified current allergies; name and call back number provided for further questions should they arise Naszir Cott RN Navigator Cardiac Imaging [facility] [phone] office [phone] cell 

## 2019-01-09 ENCOUNTER — Ambulatory Visit (HOSPITAL_COMMUNITY)
Admission: RE | Admit: 2019-01-09 | Discharge: 2019-01-09 | Disposition: A | Discharge status: Home / Self Care | Payer: 59 | Source: Ambulatory Visit | Attending: Cardiology | Admitting: Cardiology

## 2019-01-09 ENCOUNTER — Other Ambulatory Visit: Payer: Self-pay

## 2019-01-09 DIAGNOSIS — R072 Precordial pain: Secondary | ICD-10-CM

## 2019-01-09 MED ORDER — METOPROLOL TARTRATE 5 MG/5ML IV SOLN
5.0000 mg | INTRAVENOUS | Status: DC | PRN
  Filled 2019-01-09: qty 5

## 2019-01-09 MED ORDER — METOPROLOL TARTRATE 5 MG/5ML IV SOLN
INTRAVENOUS | Status: AC
  Filled 2019-01-09: qty 25

## 2019-01-09 MED ORDER — NITROGLYCERIN 0.4 MG SL SUBL
0.8000 mg | SUBLINGUAL_TABLET | Freq: Once | SUBLINGUAL | Status: AC
  Administered 2019-01-09: 0.8 mg via SUBLINGUAL
  Filled 2019-01-09: qty 25

## 2019-01-09 MED ORDER — IOHEXOL 350 MG/ML SOLN
80.0000 mL | Freq: Once | INTRAVENOUS | Status: AC | PRN
  Administered 2019-01-09: 80 mL via INTRAVENOUS

## 2019-01-09 MED ORDER — NITROGLYCERIN 0.4 MG SL SUBL
SUBLINGUAL_TABLET | SUBLINGUAL | Status: AC
  Filled 2019-01-09: qty 2

## 2019-01-09 MED ORDER — METOPROLOL TARTRATE 5 MG/5ML IV SOLN
10.0000 mg | INTRAVENOUS | Status: AC | PRN
  Administered 2019-01-09: 10 mg via INTRAVENOUS
  Filled 2019-01-09: qty 10

## 2019-01-10 NOTE — Progress Notes (Signed)
Normal coronary arteries with no blockages or calcium buildup. Chest pain not likely related to the heart.  Continue statin, if no side effects. Okay to stop Aspirin.  Thanks MJP

## 2019-01-14 NOTE — Progress Notes (Signed)
Pt aware of results 

## 2019-02-02 IMAGING — MR MR HEAD W/O CM
10 series · 48 of 48 positions shown · non-contrast
Comparison: Head CT without contrast 05/30/2016. Brain MRI
04/15/2011.

CLINICAL DATA: 45-year-old female with dizziness and headache.
Possible right basal ganglia lacunar infarct on recent head CT
(05/30/2016). Subsequent encounter.

EXAM:
MRI HEAD WITHOUT CONTRAST
TECHNIQUE: Multiplanar, multiecho pulse sequences of the brain and surrounding
structures were obtained without intravenous contrast.

[Series 2: t1_se_sag · sagittal · 5.0mm · 0.45mm/px · 3 of 21 slices shown]
[im 1/21]
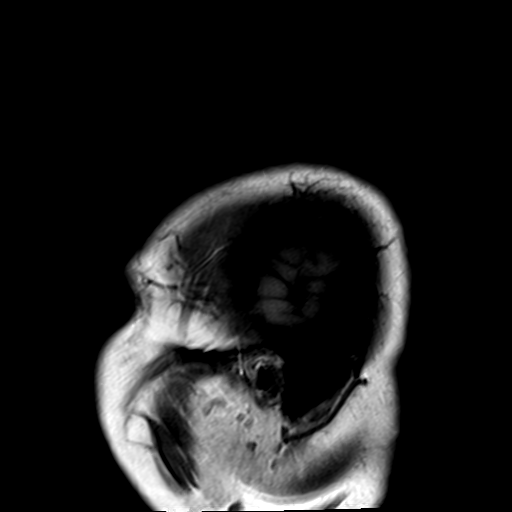
[im 11/21]
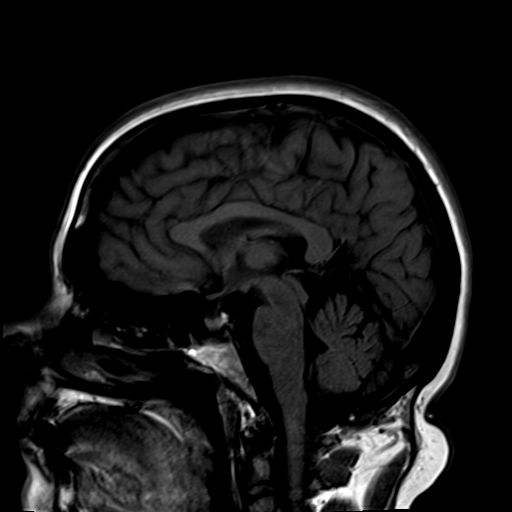
[im 21/21]
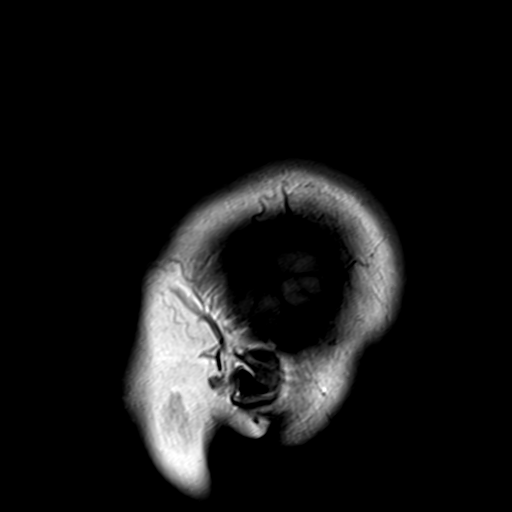

[Series 3: ep2d_diff_(id)_trace · axial · 3.0mm · 1.80mm/px · z∈[-18,+123]mm · 8 of 96 slices shown]
[im 1/96]
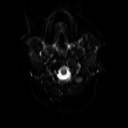
[im 14/96]
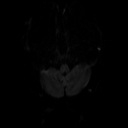
[im 28/96]
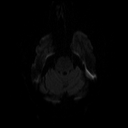
[im 41/96]
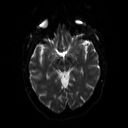
[im 55/96]
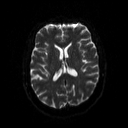
[im 68/96]
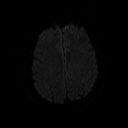
[im 82/96]
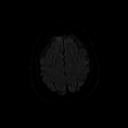
[im 96/96]
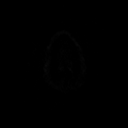

[Series 4: ep2d_diff_(id)_trace_adc · axial · 3.0mm · 1.80mm/px · z∈[-18,+123]mm · 4 of 46 slices shown]
[im 1/46]
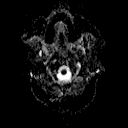
[im 16/46]
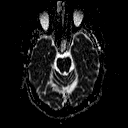
[im 31/46]
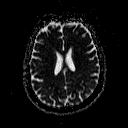
[im 46/46]
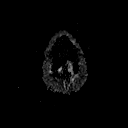

[Series 5: ep2d_diff_cor · coronal · 5.0mm · 1.77mm/px · 4 of 53 slices shown]
[im 1/53]
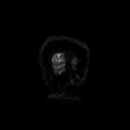
[im 18/53]
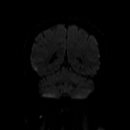
[im 35/53]
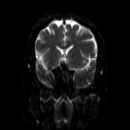
[im 53/53]
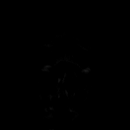

[Series 6: ep2d_diff_cor_adc · coronal · 5.0mm · 1.77mm/px · 2 of 27 slices shown]
[im 1/27]
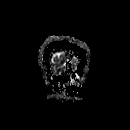
[im 27/27]
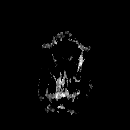

[Series 8: swi_images · axial · 2.0mm · 0.90mm/px · z∈[-33,+125]mm · 7 of 80 slices shown]
[im 1/80]
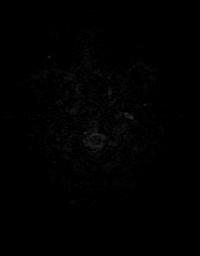
[im 14/80]
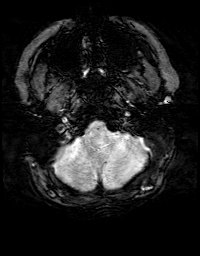
[im 27/80]
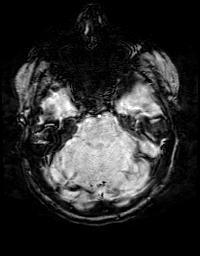
[im 40/80]
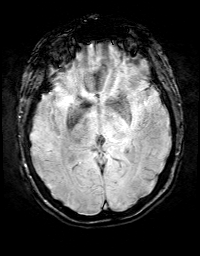
[im 53/80]
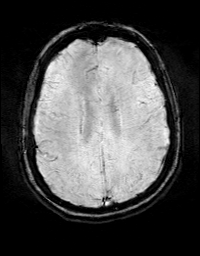
[im 66/80]
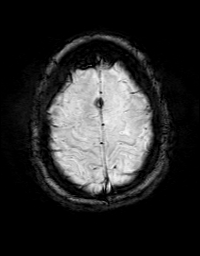
[im 80/80]
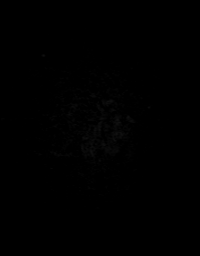

[Series 9: FLAIR · axial · 3.0mm · 0.43mm/px · z∈[-21,+126]mm · 4 of 50 slices shown]
[im 1/50]
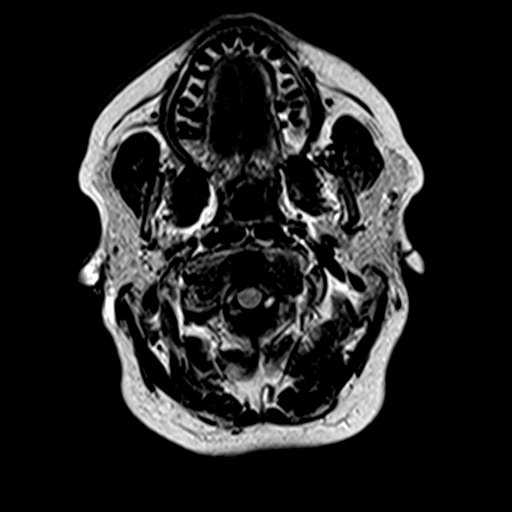
[im 17/50]
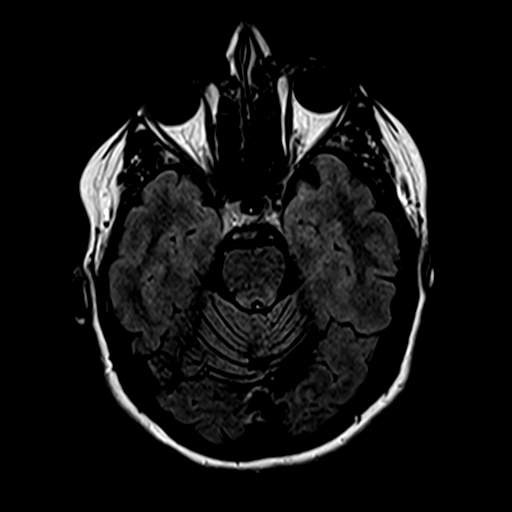
[im 33/50]
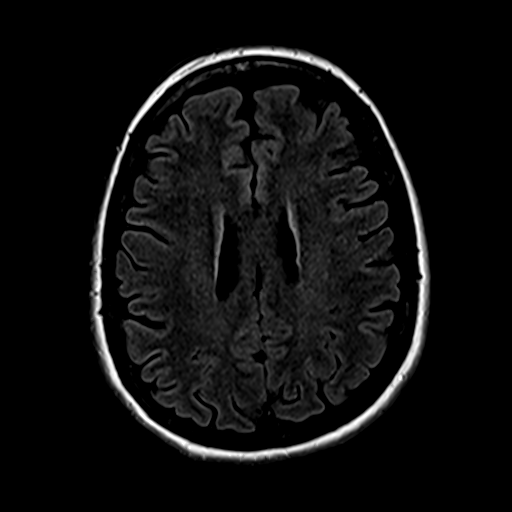
[im 50/50]
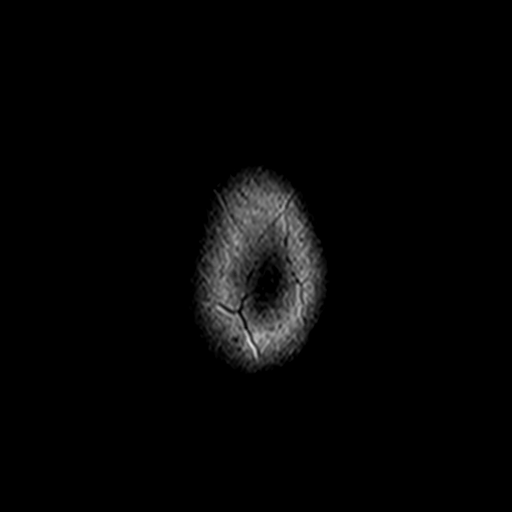

[Series 10: t2_tse_tra_512 · axial · 5.0mm · 0.60mm/px · z∈[-16,+122]mm · 2 of 24 slices shown]
[im 1/24]
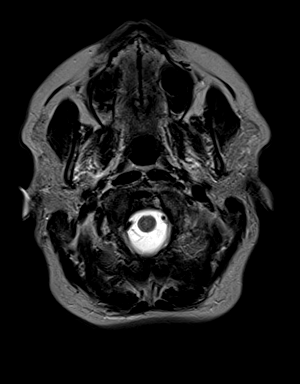
[im 24/24]
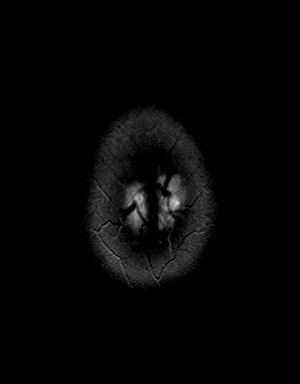

[Series 11: t1_mpr_tra · axial · 1.0mm · 0.72mm/px · z∈[-19,+123]mm · 12 of 144 slices shown]
[im 1/144]
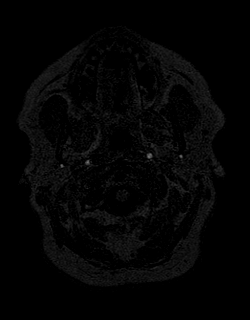
[im 14/144]
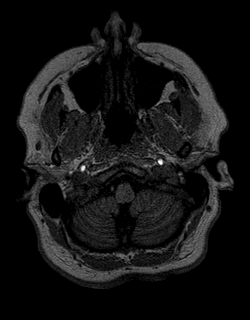
[im 27/144]
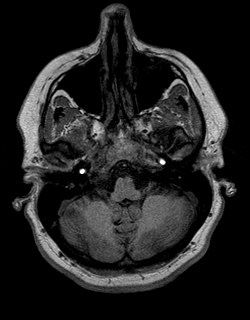
[im 40/144]
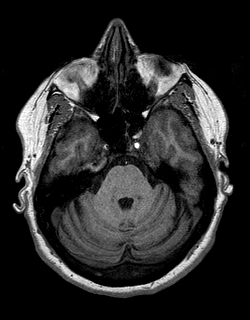
[im 53/144]
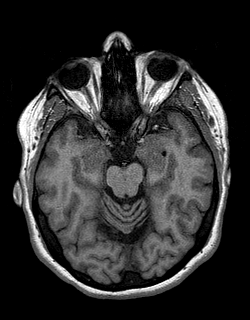
[im 66/144]
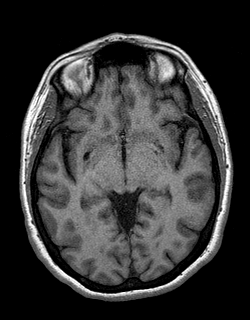
[im 79/144]
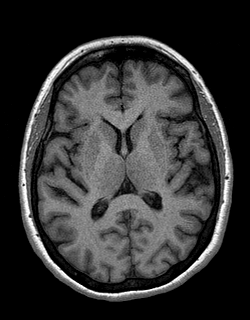
[im 92/144]
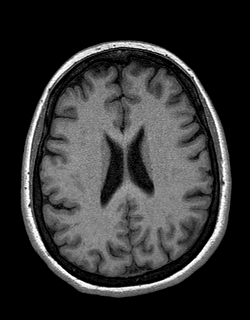
[im 105/144]
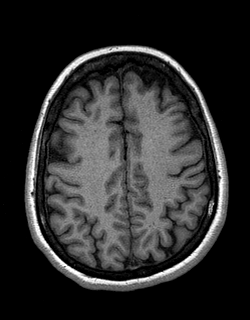
[im 118/144]
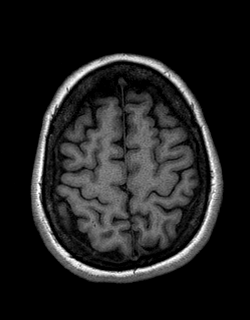
[im 131/144]
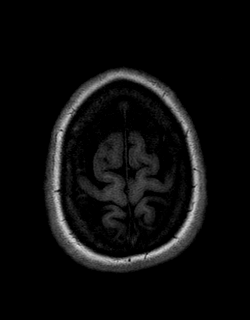
[im 144/144]
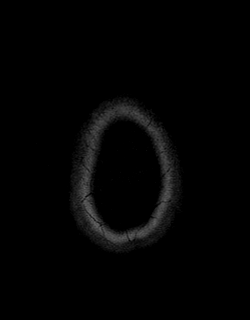

[Series 12: T2 · coronal · 5.0mm · 0.45mm/px · 2 of 28 slices shown]
[im 1/28]
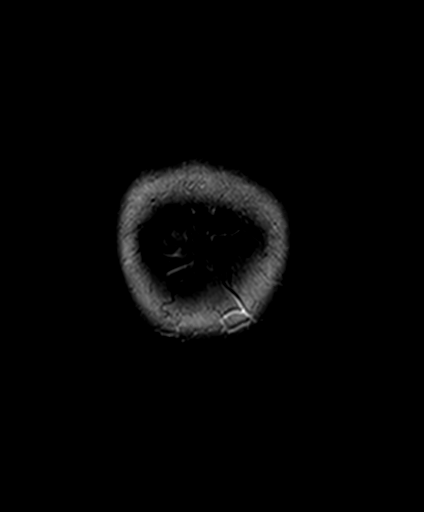
[im 28/28]
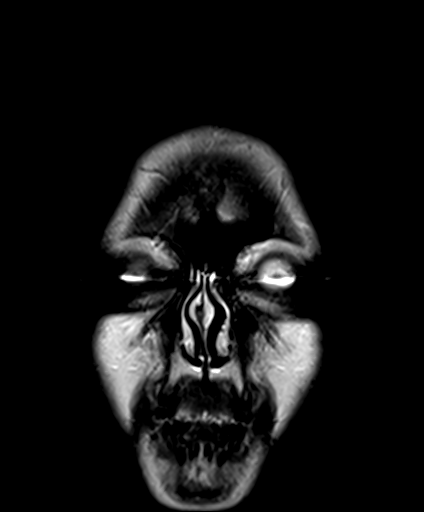

[48 of 48 positions shown; findings below may reference images not displayed]

FINDINGS: Brain: Cerebral volume is stable and within normal limits. No
restricted diffusion to suggest acute infarction. No midline shift,
mass effect, evidence of mass lesion, ventriculomegaly, extra-axial
collection or acute intracranial hemorrhage. Cervicomedullary
junction and pituitary are within normal limits.

The small hypodense area on the recent CT thought to reflect a
chronic lacunar infarct is in faxed a small dilated perivascular
space (normal variant) which is unchanged since 2064. Gray and white
matter signal is stable since 2064 and within normal limits
throughout the brain. No encephalomalacia or chronic cerebral blood
products identified.

Vascular: Major intracranial vascular flow voids are stable since
2064 and appear normal.

Skull and upper cervical spine: Negative. Visualized bone marrow
signal is within normal limits.

Sinuses/Orbits: Stable and negative. Visualized paranasal sinuses
and mastoids are stable and well pneumatized.

Other: Visible internal auditory structures appear normal. Negative
scalp soft tissues.
IMPRESSION: Stable and normal noncontrast MRI appearance of the brain.

No explanation for headache or dizziness.

This small right basal ganglia finding on the recent CT is a
perivascular space (normal variant).

## 2019-08-08 ENCOUNTER — Emergency Department (HOSPITAL_COMMUNITY)
Admission: EM | Admit: 2019-08-08 | Discharge: 2019-08-08 | Disposition: A | Discharge status: Home / Self Care | Payer: 59 | Attending: Emergency Medicine | Admitting: Emergency Medicine

## 2019-08-08 ENCOUNTER — Encounter (HOSPITAL_COMMUNITY): Payer: Self-pay

## 2019-08-08 ENCOUNTER — Emergency Department (HOSPITAL_COMMUNITY): Payer: 59

## 2019-08-08 ENCOUNTER — Other Ambulatory Visit: Payer: Self-pay

## 2019-08-08 DIAGNOSIS — R110 Nausea: Secondary | ICD-10-CM | POA: Diagnosis not present

## 2019-08-08 DIAGNOSIS — I2510 Atherosclerotic heart disease of native coronary artery without angina pectoris: Secondary | ICD-10-CM | POA: Diagnosis not present

## 2019-08-08 DIAGNOSIS — Z86711 Personal history of pulmonary embolism: Secondary | ICD-10-CM | POA: Insufficient documentation

## 2019-08-08 DIAGNOSIS — Z79899 Other long term (current) drug therapy: Secondary | ICD-10-CM | POA: Diagnosis not present

## 2019-08-08 DIAGNOSIS — I119 Hypertensive heart disease without heart failure: Secondary | ICD-10-CM | POA: Insufficient documentation

## 2019-08-08 DIAGNOSIS — R319 Hematuria, unspecified: Secondary | ICD-10-CM | POA: Diagnosis not present

## 2019-08-08 DIAGNOSIS — R1012 Left upper quadrant pain: Secondary | ICD-10-CM | POA: Insufficient documentation

## 2019-08-08 DIAGNOSIS — R109 Unspecified abdominal pain: Secondary | ICD-10-CM

## 2019-08-08 LAB — CBC WITH DIFFERENTIAL/PLATELET
Abs Immature Granulocytes: 0.05 10*3/uL (ref 0.00–0.07)
Basophils Absolute: 0 10*3/uL (ref 0.0–0.1)
Basophils Relative: 0 %
Eosinophils Absolute: 0.1 10*3/uL (ref 0.0–0.5)
Eosinophils Relative: 1 %
HCT: 44.5 % (ref 36.0–46.0)
Hemoglobin: 14.4 g/dL (ref 12.0–15.0)
Immature Granulocytes: 0 %
Lymphocytes Relative: 33 %
Lymphs Abs: 4.7 10*3/uL — ABNORMAL HIGH (ref 0.7–4.0)
MCH: 27.5 pg (ref 26.0–34.0)
MCHC: 32.4 g/dL (ref 30.0–36.0)
MCV: 85.1 fL (ref 80.0–100.0)
Monocytes Absolute: 0.7 10*3/uL (ref 0.1–1.0)
Monocytes Relative: 5 %
Neutro Abs: 8.7 10*3/uL — ABNORMAL HIGH (ref 1.7–7.7)
Neutrophils Relative %: 61 %
Platelets: 595 10*3/uL — ABNORMAL HIGH (ref 150–400)
RBC: 5.23 MIL/uL — ABNORMAL HIGH (ref 3.87–5.11)
RDW: 13.4 % (ref 11.5–15.5)
WBC: 14.3 10*3/uL — ABNORMAL HIGH (ref 4.0–10.5)
nRBC: 0 % (ref 0.0–0.2)

## 2019-08-08 LAB — COMPREHENSIVE METABOLIC PANEL
ALT: 24 U/L (ref 0–44)
AST: 21 U/L (ref 15–41)
Albumin: 3.7 g/dL (ref 3.5–5.0)
Alkaline Phosphatase: 126 U/L (ref 38–126)
Anion gap: 8 (ref 5–15)
BUN: 9 mg/dL (ref 6–20)
CO2: 25 mmol/L (ref 22–32)
Calcium: 9.1 mg/dL (ref 8.9–10.3)
Chloride: 105 mmol/L (ref 98–111)
Creatinine, Ser: 0.62 mg/dL (ref 0.44–1.00)
GFR calc Af Amer: >60 mL/min (ref >60)
GFR calc non Af Amer: >60 mL/min (ref >60)
Glucose, Bld: 89 mg/dL (ref 70–99)
Potassium: 3.9 mmol/L (ref 3.5–5.1)
Sodium: 138 mmol/L (ref 135–145)
Total Bilirubin: 0.9 mg/dL (ref 0.3–1.2)
Total Protein: 7.6 g/dL (ref 6.5–8.1)

## 2019-08-08 LAB — URINALYSIS, ROUTINE W REFLEX MICROSCOPIC
Bilirubin Urine: NEGATIVE
Glucose, UA: NEGATIVE mg/dL
Ketones, ur: NEGATIVE mg/dL
Leukocytes,Ua: NEGATIVE
Nitrite: NEGATIVE
Protein, ur: NEGATIVE mg/dL
Specific Gravity, Urine: >1.046 — ABNORMAL HIGH (ref 1.005–1.030)
pH: 5 (ref 5.0–8.0)

## 2019-08-08 LAB — I-STAT BETA HCG BLOOD, ED (MC, WL, AP ONLY): I-stat hCG, quantitative: <5 m[IU]/mL (ref <5)

## 2019-08-08 LAB — LIPASE, BLOOD: Lipase: 23 U/L (ref 11–51)

## 2019-08-08 MED ORDER — NAPROXEN 500 MG PO TABS: 500.0000 mg | ORAL_TABLET | Freq: Two times a day (BID) | ORAL | 0 refills | Status: AC

## 2019-08-08 MED ORDER — OMEPRAZOLE 20 MG PO CPDR
20.0000 mg | DELAYED_RELEASE_CAPSULE | Freq: Every day | ORAL | 0 refills | Status: AC
Start: 2019-08-08 — End: ?

## 2019-08-08 MED ORDER — SODIUM CHLORIDE (PF) 0.9 % IJ SOLN
INTRAMUSCULAR | Status: AC
  Filled 2019-08-08: qty 50

## 2019-08-08 MED ORDER — IOHEXOL 300 MG/ML  SOLN
100.0000 mL | Freq: Once | INTRAMUSCULAR | Status: AC | PRN
  Administered 2019-08-08: 100 mL via INTRAVENOUS

## 2019-08-08 NOTE — ED Notes (Signed)
An After Visit Summary was printed and given to the patient. Discharge instructions given and no further questions at this time.  

## 2019-08-08 NOTE — ED Triage Notes (Signed)
Pt c/o side pain that radiates to the back. Pt PCP recommended pt come to ED for possible pancreatitis. Pt c/o nausea but denies emesis.

## 2019-08-08 NOTE — ED Provider Notes (Signed)
[facility]-EMERGENCY DEPT Provider Note   CSN: [id number] Arrival date & time: 08/08/19  1003     History Chief Complaint  Patient presents with  . Flank Pain    Jill [name] is a 49 y.o. female.  HPI   Patient presents to the ED for evaluation of flank and abdominal pain.  Patient states the symptoms started last evening.  She started having pain in her middle and left upper abdomen.  She tried taking antacids without relief.  Patient's had some nausea but no vomiting.  She denies any fevers or chills.  Some loose stools but no copious diarrhea.  Patient went to her primary care doctor's office who suggested she come to the ED for further evaluation.  Patient drinks alcohol only occasionally.  She has had no prior abdominal surgery.  No history of kidney stones.  Past Medical History:  Diagnosis Date  . Coronary artery disease    Left-sided  . HTN (hypertension) 12/02/2018  . Hyperlipemia 12/02/2018  . Hyperlipidemia   . Hypertension   . Hypothyroid 12/02/2018  . Lupus anticoagulant positive   . Obesity   . Pulmonary embolism (HCC)   . Thrombocytopenia (HCC)   . Thyroid disease   . Vitamin D deficiency     Patient Active Problem List   Diagnosis Date Noted  . HTN (hypertension) 12/02/2018  . Hyperlipemia 12/02/2018  . Hypothyroid 12/02/2018  . Precordial pain 12/02/2018    Past Surgical History:  Procedure Laterality Date  . NOVASURE ABLATION  2016     OB History   No obstetric history on file.     Family History  Problem Relation Age of Onset  . Hypertension Mother   . Breast cancer Mother   . Skin cancer Father   . Stroke Father   . Heart attack Father   . Hypertension Father   . High Cholesterol Father   . Neuropathy Neg Hx     Social History   Tobacco Use  . Smoking status: Never Smoker  . Smokeless tobacco: Never Used  Substance Use Topics  . Alcohol use: Yes    Comment: Rare  . Drug use: No    Home  Medications Prior to Admission medications   Medication Sig Start Date End Date Taking? Authorizing Provider  amLODipine (NORVASC) 5 MG tablet Take 5 mg by mouth daily.  06/27/16   [provider]  aspirin EC 81 MG tablet Take 81 mg by mouth daily.    [provider]  atorvastatin (LIPITOR) 40 MG tablet Take 40 mg by mouth daily at 6 PM.  07/10/16   [provider]  losartan (COZAAR) 100 MG tablet Take 100 mg by mouth daily.  07/15/16   [provider]  metoprolol tartrate (LOPRESSOR) 25 MG tablet Take 1 tablet (25 mg total) by mouth 2 (two) times daily. Please take 1 pill 2 hr before the CT scan. Please take second pill with you to the scan. This could be given to you, if necessary. 01/07/19 01/07/20  Patwardhan, Anabel Bene, MD  naproxen (NAPROSYN) 500 MG tablet Take 1 tablet (500 mg total) by mouth 2 (two) times daily with a meal. As needed for pain 08/08/19   Linwood Dibbles, MD  omeprazole (PRILOSEC) 20 MG capsule Take 1 capsule (20 mg total) by mouth daily. 08/08/19   Linwood Dibbles, MD  SYNTHROID 200 MCG tablet Take 200 mcg by mouth daily before breakfast.  05/29/16   [provider]  Allergies    Patient has no known allergies.  Review of Systems   Review of Systems  All other systems reviewed and are negative.   Physical Exam Updated Vital Signs BP (!) 141/77   Pulse 67   Temp 98.7 F (37.1 C) (Oral)   Resp 18   Ht 1.6 m (5\' 3" )   Wt 102.1 kg   SpO2 99%   BMI 39.86 kg/m   Physical Exam Vitals and nursing note reviewed.  Constitutional:      General: She is not in acute distress.    Appearance: She is well-developed.  HENT:     Head: Normocephalic and atraumatic.     Right Ear: External ear normal.     Left Ear: External ear normal.  Eyes:     General: No scleral icterus.       Right eye: No discharge.        Left eye: No discharge.     Conjunctiva/sclera: Conjunctivae normal.  Neck:     Trachea: No tracheal deviation.    Cardiovascular:     Rate and Rhythm: Normal rate and regular rhythm.  Pulmonary:     Effort: Pulmonary effort is normal. No respiratory distress.     Breath sounds: Normal breath sounds. No stridor. No wheezing or rales.  Abdominal:     General: Bowel sounds are normal. There is no distension.     Palpations: Abdomen is soft.     Tenderness: There is abdominal tenderness in the epigastric area and left upper quadrant. There is guarding. There is no rebound.  Musculoskeletal:        General: No tenderness.     Cervical back: Neck supple.  Skin:    General: Skin is warm and dry.     Findings: No rash.  Neurological:     Mental Status: She is alert.     Cranial Nerves: No cranial nerve deficit (no facial droop, extraocular movements intact, no slurred speech).     Sensory: No sensory deficit.     Motor: No abnormal muscle tone or seizure activity.     Coordination: Coordination normal.     ED Results / Procedures / Treatments   Labs (all labs ordered are listed, but only abnormal results are displayed) Labs Reviewed  CBC WITH DIFFERENTIAL/PLATELET - Abnormal; Notable for the following components:      Result Value   WBC 14.3 (*)    RBC 5.23 (*)    Platelets 595 (*)    Neutro Abs 8.7 (*)    Lymphs Abs 4.7 (*)    All other components within normal limits  URINALYSIS, ROUTINE W REFLEX MICROSCOPIC - Abnormal; Notable for the following components:   APPearance HAZY (*)    Specific Gravity, Urine >1.046 (*)    Hgb urine dipstick SMALL (*)    Bacteria, UA RARE (*)    All other components within normal limits  URINE CULTURE  COMPREHENSIVE METABOLIC PANEL  LIPASE, BLOOD  I-STAT BETA HCG BLOOD, ED ([facility], [facility], [facility] ONLY)    EKG None  Radiology CT ABDOMEN PELVIS W CONTRAST  Result Date: 08/08/2019 CLINICAL DATA:  49 year old female with acute abdominal pain and nausea. EXAM: CT ABDOMEN AND PELVIS WITH CONTRAST TECHNIQUE: Multidetector CT imaging of the abdomen and pelvis was  performed using the standard protocol following bolus administration of intravenous contrast. CONTRAST:  143mL OMNIPAQUE IOHEXOL 300 MG/ML  SOLN COMPARISON:  None. FINDINGS: Lower chest: No acute abnormality Hepatobiliary: The liver and gallbladder are  unremarkable. No biliary dilatation. Pancreas: Unremarkable Spleen: Unremarkable Adrenals/Urinary Tract: The kidneys, adrenal glands and bladder are unremarkable. Stomach/Bowel: Stomach is within normal limits. No evidence of bowel wall thickening, distention, or inflammatory changes. Vascular/Lymphatic: No significant vascular findings are present. No enlarged abdominal or pelvic lymph nodes. Reproductive: Uterus and bilateral adnexa are unremarkable. Other: No ascites, focal collection, pneumoperitoneum or abdominal wall hernia. Musculoskeletal: No acute or suspicious bony abnormalities. IMPRESSION: Unremarkable CT of the abdomen and pelvis. Electronically Signed   By: Harmon Pier M.D.   On: 08/08/2019 14:00    Procedures Procedures (including critical care time)  Medications Ordered in ED Medications  sodium chloride (PF) 0.9 % injection (has no administration in time range)  iohexol (OMNIPAQUE) 300 MG/ML solution 100 mL (100 mLs Intravenous Contrast Given 08/08/19 1338)    ED Course  I have reviewed the triage vital signs and the nursing notes.  Pertinent labs & imaging results that were available during my care of the patient were reviewed by me and considered in my medical decision making (see chart for details).  Clinical Course as of Aug 08 1647  Fri Aug 08, 2019  1121 Pt does not want anything for pain right now   [JK]  1222 Labs reviewed.  LFTs and lipase normal.  Leukocytosis noted   [JK]  1531 CT scan findings reviewed.  No acute abnormality.  Patient has provided a urine sample   [JK]    Clinical Course User Index [JK] Linwood Dibbles, MD   MDM Rules/Calculators/A&P                      Patient presented with flank pain.  Patient  also has an elevated white blood cell count.  Urinalysis did show 21-50 red blood cells.  CT scan was performed and there is no acute abnormalities noted.  No diverticulitis.  No hydronephrosis.  No kidney stones noted on the contrasted study.  We will treat the patient with NSAIDs and antacids.  Outpatient follow-up with your primary care doctor. Final Clinical Impression(s) / ED Diagnoses Final diagnoses:  Flank pain  Hematuria, unspecified type    Rx / DC Orders ED Discharge Orders         Ordered    naproxen (NAPROSYN) 500 MG tablet  2 times daily with meals     08/08/19 1648    omeprazole (PRILOSEC) 20 MG capsule  Daily     08/08/19 1648           Linwood Dibbles, MD 08/08/19 1650

## 2019-08-08 NOTE — Discharge Instructions (Addendum)
Take the medications as prescribed.  Follow-up with your primary care doctor to have your urine rechecked as we discussed.  Return for fever worsening symptoms

## 2019-08-09 LAB — URINE CULTURE: Culture: 30000 — AB

## 2019-12-12 ENCOUNTER — Other Ambulatory Visit: Payer: Self-pay | Admitting: Physician Assistant

## 2019-12-12 DIAGNOSIS — I10 Essential (primary) hypertension: Secondary | ICD-10-CM

## 2019-12-12 DIAGNOSIS — U071 COVID-19: Secondary | ICD-10-CM

## 2019-12-12 NOTE — Progress Notes (Signed)
I connected by phone with Jill [name] on 12/12/2019 at 11:11 AM to discuss the potential use of a new treatment for mild to moderate COVID-19 viral infection in non-hospitalized patients.  This patient is a 49 y.o. female that meets the FDA criteria for Emergency Use Authorization of COVID monoclonal antibody casirivimab/imdevimab.  Has a (+) direct SARS-CoV-2 viral test result  Has mild or moderate COVID-19   Is NOT hospitalized due to COVID-19  Is within 10 days of symptom onset  Has at least one of the high risk factor(s) for progression to severe COVID-19 and/or hospitalization as defined in EUA.  Specific high risk criteria : Cardiovascular disease or hypertension   I have spoken and communicated the following to the patient or parent/caregiver regarding COVID monoclonal antibody treatment:  1. FDA has authorized the emergency use for the treatment of mild to moderate COVID-19 in adults and pediatric patients with positive results of direct SARS-CoV-2 viral testing who are 71 years of age and older weighing at least 40 kg, and who are at high risk for progressing to severe COVID-19 and/or hospitalization.  2. The significant known and potential risks and benefits of COVID monoclonal antibody, and the extent to which such potential risks and benefits are unknown.  3. Information on available alternative treatments and the risks and benefits of those alternatives, including clinical trials.  4. Patients treated with COVID monoclonal antibody should continue to self-isolate and use infection control measures (e.g., wear mask, isolate, social distance, avoid sharing personal items, clean and disinfect "high touch" surfaces, and frequent handwashing) according to CDC guidelines.   5. The patient or parent/caregiver has the option to accept or refuse COVID monoclonal antibody treatment.  After reviewing this information with the patient, The patient agreed to proceed with receiving  casirivimab\imdevimab infusion and will be provided a copy of the Fact sheet prior to receiving the infusion.   Osmany Azer 12/12/2019 11:11 AM

## 2019-12-13 ENCOUNTER — Ambulatory Visit (HOSPITAL_COMMUNITY)
Admission: RE | Admit: 2019-12-13 | Discharge: 2019-12-13 | Disposition: A | Discharge status: Home / Self Care | Payer: 59 | Source: Ambulatory Visit | Attending: Pulmonary Disease | Admitting: Pulmonary Disease

## 2019-12-13 DIAGNOSIS — U071 COVID-19: Secondary | ICD-10-CM | POA: Diagnosis present

## 2019-12-13 MED ORDER — ALBUTEROL SULFATE HFA 108 (90 BASE) MCG/ACT IN AERS: 2.0000 | INHALATION_SPRAY | Freq: Once | RESPIRATORY_TRACT | Status: DC | PRN

## 2019-12-13 MED ORDER — EPINEPHRINE 0.3 MG/0.3ML IJ SOAJ: 0.3000 mg | Freq: Once | INTRAMUSCULAR | Status: DC | PRN

## 2019-12-13 MED ORDER — SODIUM CHLORIDE 0.9 % IV SOLN
1200.0000 mg | Freq: Once | INTRAVENOUS | Status: AC
  Administered 2019-12-13: 1200 mg via INTRAVENOUS
  Filled 2019-12-13: qty 10

## 2019-12-13 MED ORDER — DIPHENHYDRAMINE HCL 50 MG/ML IJ SOLN: 50.0000 mg | Freq: Once | INTRAMUSCULAR | Status: DC | PRN

## 2019-12-13 MED ORDER — SODIUM CHLORIDE 0.9 % IV SOLN: INTRAVENOUS | Status: DC | PRN

## 2019-12-13 MED ORDER — METHYLPREDNISOLONE SODIUM SUCC 125 MG IJ SOLR: 125.0000 mg | Freq: Once | INTRAMUSCULAR | Status: DC | PRN

## 2019-12-13 MED ORDER — FAMOTIDINE IN NACL 20-0.9 MG/50ML-% IV SOLN: 20.0000 mg | Freq: Once | INTRAVENOUS | Status: DC | PRN

## 2019-12-13 NOTE — Progress Notes (Signed)
  Diagnosis: COVID-19  Physician:Dr Wright  Procedure: [facility] Med: casirivimab\imdevimab infusion - Provided patient with casirivimab\imdevimab fact sheet for patients, parents and caregivers prior to infusion.  Complications: No immediate complications noted.  Discharge: Discharged home   Jill [name] 12/13/2019  

## 2019-12-13 NOTE — Discharge Instructions (Signed)

## 2020-04-12 DIAGNOSIS — M160 Bilateral primary osteoarthritis of hip: Secondary | ICD-10-CM | POA: Diagnosis not present

## 2020-04-12 DIAGNOSIS — I10 Essential (primary) hypertension: Secondary | ICD-10-CM | POA: Diagnosis not present

## 2020-04-12 DIAGNOSIS — E039 Hypothyroidism, unspecified: Secondary | ICD-10-CM | POA: Diagnosis not present

## 2020-04-12 DIAGNOSIS — M533 Sacrococcygeal disorders, not elsewhere classified: Secondary | ICD-10-CM | POA: Diagnosis not present

## 2020-04-12 DIAGNOSIS — E6609 Other obesity due to excess calories: Secondary | ICD-10-CM | POA: Diagnosis not present

## 2020-04-12 DIAGNOSIS — Z0000 Encounter for general adult medical examination without abnormal findings: Secondary | ICD-10-CM | POA: Diagnosis not present

## 2020-04-12 DIAGNOSIS — E782 Mixed hyperlipidemia: Secondary | ICD-10-CM | POA: Diagnosis not present

## 2021-03-23 DIAGNOSIS — J029 Acute pharyngitis, unspecified: Secondary | ICD-10-CM | POA: Diagnosis not present

## 2021-03-23 DIAGNOSIS — Z20828 Contact with and (suspected) exposure to other viral communicable diseases: Secondary | ICD-10-CM | POA: Diagnosis not present

## 2021-03-23 DIAGNOSIS — J040 Acute laryngitis: Secondary | ICD-10-CM | POA: Diagnosis not present

## 2021-03-23 DIAGNOSIS — R051 Acute cough: Secondary | ICD-10-CM | POA: Diagnosis not present

## 2021-04-29 DIAGNOSIS — X58XXXA Exposure to other specified factors, initial encounter: Secondary | ICD-10-CM | POA: Diagnosis not present

## 2021-04-29 DIAGNOSIS — R82998 Other abnormal findings in urine: Secondary | ICD-10-CM | POA: Diagnosis not present

## 2021-04-29 DIAGNOSIS — S238XXA Sprain of other specified parts of thorax, initial encounter: Secondary | ICD-10-CM | POA: Diagnosis not present

## 2021-09-14 DIAGNOSIS — Z0000 Encounter for general adult medical examination without abnormal findings: Secondary | ICD-10-CM | POA: Diagnosis not present

## 2021-09-21 DIAGNOSIS — I10 Essential (primary) hypertension: Secondary | ICD-10-CM | POA: Diagnosis not present

## 2021-09-21 DIAGNOSIS — E782 Mixed hyperlipidemia: Secondary | ICD-10-CM | POA: Diagnosis not present

## 2021-09-21 DIAGNOSIS — Z0000 Encounter for general adult medical examination without abnormal findings: Secondary | ICD-10-CM | POA: Diagnosis not present

## 2021-09-21 DIAGNOSIS — E039 Hypothyroidism, unspecified: Secondary | ICD-10-CM | POA: Diagnosis not present

## 2021-10-19 DIAGNOSIS — I10 Essential (primary) hypertension: Secondary | ICD-10-CM | POA: Diagnosis not present

## 2021-10-19 DIAGNOSIS — Z1211 Encounter for screening for malignant neoplasm of colon: Secondary | ICD-10-CM | POA: Diagnosis not present

## 2021-10-19 DIAGNOSIS — K625 Hemorrhage of anus and rectum: Secondary | ICD-10-CM | POA: Diagnosis not present

## 2021-11-08 DIAGNOSIS — Z1211 Encounter for screening for malignant neoplasm of colon: Secondary | ICD-10-CM | POA: Diagnosis not present

## 2021-11-08 DIAGNOSIS — K635 Polyp of colon: Secondary | ICD-10-CM | POA: Diagnosis not present

## 2021-11-08 DIAGNOSIS — K6389 Other specified diseases of intestine: Secondary | ICD-10-CM | POA: Diagnosis not present

## 2022-04-20 DIAGNOSIS — B029 Zoster without complications: Secondary | ICD-10-CM | POA: Diagnosis not present

## 2022-05-24 DIAGNOSIS — I10 Essential (primary) hypertension: Secondary | ICD-10-CM | POA: Diagnosis not present

## 2022-05-24 DIAGNOSIS — R21 Rash and other nonspecific skin eruption: Secondary | ICD-10-CM | POA: Diagnosis not present

## 2022-05-24 DIAGNOSIS — R110 Nausea: Secondary | ICD-10-CM | POA: Diagnosis not present

## 2022-05-24 DIAGNOSIS — R109 Unspecified abdominal pain: Secondary | ICD-10-CM | POA: Diagnosis not present

## 2022-05-24 DIAGNOSIS — R509 Fever, unspecified: Secondary | ICD-10-CM | POA: Diagnosis not present

## 2022-05-31 DIAGNOSIS — R109 Unspecified abdominal pain: Secondary | ICD-10-CM | POA: Diagnosis not present

## 2022-05-31 DIAGNOSIS — K760 Fatty (change of) liver, not elsewhere classified: Secondary | ICD-10-CM | POA: Diagnosis not present

## 2022-06-02 ENCOUNTER — Other Ambulatory Visit: Payer: Self-pay | Admitting: Internal Medicine

## 2022-06-02 DIAGNOSIS — N289 Disorder of kidney and ureter, unspecified: Secondary | ICD-10-CM

## 2022-06-03 DIAGNOSIS — U071 COVID-19: Secondary | ICD-10-CM | POA: Diagnosis not present

## 2022-06-08 ENCOUNTER — Other Ambulatory Visit (HOSPITAL_COMMUNITY): Payer: Self-pay | Admitting: Internal Medicine

## 2022-06-08 DIAGNOSIS — N289 Disorder of kidney and ureter, unspecified: Secondary | ICD-10-CM

## 2022-06-13 ENCOUNTER — Encounter (HOSPITAL_COMMUNITY): Payer: Self-pay

## 2022-06-13 ENCOUNTER — Ambulatory Visit (HOSPITAL_COMMUNITY): Payer: BLUE CROSS/BLUE SHIELD

## 2022-06-28 ENCOUNTER — Ambulatory Visit (HOSPITAL_COMMUNITY)
Admission: RE | Admit: 2022-06-28 | Discharge: 2022-06-28 | Disposition: A | Discharge status: Home / Self Care | Payer: BLUE CROSS/BLUE SHIELD | Source: Ambulatory Visit | Attending: Internal Medicine | Admitting: Internal Medicine

## 2022-06-28 DIAGNOSIS — N289 Disorder of kidney and ureter, unspecified: Secondary | ICD-10-CM | POA: Diagnosis not present

## 2022-06-28 DIAGNOSIS — K5730 Diverticulosis of large intestine without perforation or abscess without bleeding: Secondary | ICD-10-CM | POA: Diagnosis not present

## 2022-06-28 LAB — POCT I-STAT CREATININE: Creatinine, Ser: 0.6 mg/dL (ref 0.44–1.00)

## 2022-06-28 MED ORDER — IOHEXOL 300 MG/ML  SOLN
100.0000 mL | Freq: Once | INTRAMUSCULAR | Status: AC | PRN
  Administered 2022-06-28: 100 mL via INTRAVENOUS

## 2022-07-12 DIAGNOSIS — E782 Mixed hyperlipidemia: Secondary | ICD-10-CM | POA: Diagnosis not present

## 2022-07-12 DIAGNOSIS — I10 Essential (primary) hypertension: Secondary | ICD-10-CM | POA: Diagnosis not present

## 2022-07-12 DIAGNOSIS — E039 Hypothyroidism, unspecified: Secondary | ICD-10-CM | POA: Diagnosis not present

## 2022-07-12 DIAGNOSIS — R760 Raised antibody titer: Secondary | ICD-10-CM | POA: Diagnosis not present

## 2022-08-31 DIAGNOSIS — Z713 Dietary counseling and surveillance: Secondary | ICD-10-CM | POA: Diagnosis not present

## 2022-08-31 DIAGNOSIS — Z7689 Persons encountering health services in other specified circumstances: Secondary | ICD-10-CM | POA: Diagnosis not present

## 2022-08-31 DIAGNOSIS — Z6841 Body Mass Index (BMI) 40.0 and over, adult: Secondary | ICD-10-CM | POA: Diagnosis not present

## 2022-09-20 ENCOUNTER — Encounter (HOSPITAL_COMMUNITY): Payer: Self-pay

## 2022-09-20 ENCOUNTER — Emergency Department (HOSPITAL_COMMUNITY)
Admission: EM | Admit: 2022-09-20 | Discharge: 2022-09-20 | Disposition: A | Discharge status: Home / Self Care | Payer: BLUE CROSS/BLUE SHIELD | Attending: Emergency Medicine | Admitting: Emergency Medicine

## 2022-09-20 ENCOUNTER — Other Ambulatory Visit: Payer: Self-pay

## 2022-09-20 DIAGNOSIS — H66001 Acute suppurative otitis media without spontaneous rupture of ear drum, right ear: Secondary | ICD-10-CM | POA: Insufficient documentation

## 2022-09-20 MED ORDER — AMOXICILLIN 500 MG PO CAPS: 1000.0000 mg | ORAL_CAPSULE | Freq: Two times a day (BID) | ORAL | 0 refills | Status: AC

## 2022-09-20 MED ORDER — AMOXICILLIN 500 MG PO CAPS
1000.0000 mg | ORAL_CAPSULE | Freq: Once | ORAL | Status: AC
  Administered 2022-09-20: 1000 mg via ORAL
  Filled 2022-09-20: qty 2

## 2022-09-20 NOTE — Discharge Instructions (Addendum)
You were seen in the emergency department for jaw pain. You have an acute ear infection in the right ear that will require antibiotic therapy to treat. Please take this antibiotic as prescribed. If you have any worsening of your symptoms, please return to the ER. Otherwise, follow up with your primary care provider.

## 2022-09-20 NOTE — ED Triage Notes (Signed)
Right sided jaw pain that started today. Pt was concerned for tooth abscess or ear infection. Jaw pain does not radiate. Pt does have some nausea and a headache. Sent by [facility]

## 2022-09-20 NOTE — ED Provider Notes (Signed)
[place] EMERGENCY DEPARTMENT AT [facility] Provider Note   CSN: [id number] Arrival date & time: 09/20/22  1902     History Chief Complaint  Patient presents with   Jaw Pain    Jill [name] is a 52 y.o. female.  Patient presents to the emergency department complaints of right-sided ear pain.  She reports this pain is been present for the last few days without improvement in symptoms.  Was seen at urgent care earlier today and diagnosed with an ear infection but advised to come to the emergency department.  Currently denying radiating pain into the neck, denies chest pain or shortness of breath.  No significant past medical history.  HPI     Home Medications Prior to Admission medications   Medication Sig Start Date End Date Taking? Authorizing Provider  amoxicillin (AMOXIL) 500 MG capsule Take 2 capsules (1,000 mg total) by mouth 2 (two) times daily for 7 days. 09/20/22 09/27/22 Yes Smitty Knudsen, PA-C  amLODipine (NORVASC) 5 MG tablet Take 5 mg by mouth daily.  06/27/16   [provider]  aspirin EC 81 MG tablet Take 81 mg by mouth daily.    [provider]  atorvastatin (LIPITOR) 40 MG tablet Take 40 mg by mouth daily at 6 PM.  07/10/16   [provider]  losartan (COZAAR) 100 MG tablet Take 100 mg by mouth daily.  07/15/16   [provider]  metoprolol tartrate (LOPRESSOR) 25 MG tablet Take 1 tablet (25 mg total) by mouth 2 (two) times daily. Please take 1 pill 2 hr before the CT scan. Please take second pill with you to the scan. This could be given to you, if necessary. 01/07/19 01/07/20  Patwardhan, Anabel Bene, MD  naproxen (NAPROSYN) 500 MG tablet Take 1 tablet (500 mg total) by mouth 2 (two) times daily with a meal. As needed for pain 08/08/19   Linwood Dibbles, MD  omeprazole (PRILOSEC) 20 MG capsule Take 1 capsule (20 mg total) by mouth daily. 08/08/19   Linwood Dibbles, MD  SYNTHROID 200 MCG tablet Take 200 mcg by mouth daily before  breakfast.  05/29/16   [provider]      Allergies    Patient has no known allergies.    Review of Systems   Review of Systems  HENT:  Positive for ear pain.   All other systems reviewed and are negative.   Physical Exam Updated Vital Signs BP (!) 144/90 (BP Location: Left Arm)   Pulse 93   Temp 98.3 F (36.8 C) (Oral)   Resp 17   Ht 5\' 3"  (1.6 m)   Wt 103.4 kg   SpO2 100%   BMI 40.39 kg/m  Physical Exam Vitals and nursing note reviewed.  Constitutional:      General: She is not in acute distress.    Appearance: She is well-developed.  HENT:     Head: Normocephalic and atraumatic.     Right Ear: Ear canal and external ear normal. A middle ear effusion is present. Tympanic membrane is injected and bulging.     Left Ear: Ear canal and external ear normal.  No middle ear effusion. Tympanic membrane is not injected or bulging.     Nose: Nose normal.     Mouth/Throat:     Pharynx: No oropharyngeal exudate or posterior oropharyngeal erythema.     Comments: No uvular deviation Eyes:     Conjunctiva/sclera: Conjunctivae normal.  Cardiovascular:     Rate  and Rhythm: Normal rate and regular rhythm.     Heart sounds: No murmur heard. Pulmonary:     Effort: Pulmonary effort is normal. No respiratory distress.     Breath sounds: Normal breath sounds.  Abdominal:     Palpations: Abdomen is soft.     Tenderness: There is no abdominal tenderness.  Musculoskeletal:        General: No swelling.     Cervical back: Neck supple. No tenderness.  Lymphadenopathy:     Cervical: No cervical adenopathy.  Skin:    General: Skin is warm and dry.     Capillary Refill: Capillary refill takes less than 2 seconds.  Neurological:     Mental Status: She is alert.  Psychiatric:        Mood and Affect: Mood normal.     ED Results / Procedures / Treatments   Labs (all labs ordered are listed, but only abnormal results are displayed) Labs Reviewed - No data to  display  EKG None  Radiology No results found.  Procedures Procedures   Medications Ordered in ED Medications  amoxicillin (AMOXIL) capsule 1,000 mg (1,000 mg Oral Given 09/20/22 2127)    ED Course/ Medical Decision Making/ A&P                           Medical Decision Making Risk Prescription drug management.   This patient presents to the ED for concern of jaw pain.  Differential diagnosis includes acute otitis media, otitis externa, mastoiditis, trigeminal neuralgia   Problem List / ED Course:  Patient presents emergency department complaints of right-sided jaw pain.  Reports that symptoms began a few days ago but is worsened today.  Concern about possible dental abscess or ear infection was seen at urgent care earlier today and diagnosed with an ear infection advised to come to the emergency department for evaluation.  Patient currently denies any radiating pain from this right ear.  Denies any chest pain or shortness of breath.  Low concern for cardiac abnormalities as EKG is reassuring and patient is not symptomatic at this time.  Physical examination does reveal obvious signs of an acute otitis media given erythematous tympanic membrane with bulging noted within suppurative fluid present behind tympanic membrane.  Given combinations of patient's history, physical exam findings, with the patient has an acute otitis media infection would benefit from outpatient antibiotic therapy.  Advised patient of this plan and patient is agreeable with this.  Will have patient follow-up with primary care provider for further evaluation or return to the emergency department if she has any acute worsening of symptoms.  Patient agreeable with treatment plan verbalized understanding all return precautions.  Final Clinical Impression(s) / ED Diagnoses Final diagnoses:  Non-recurrent acute suppurative otitis media of right ear without spontaneous rupture of tympanic membrane    Rx / DC  Orders ED Discharge Orders          Ordered    amoxicillin (AMOXIL) 500 MG capsule  2 times daily        09/20/22 2120              Salomon Mast 09/20/22 2130    Glyn Ade, MD 09/20/22 2249

## 2022-09-20 NOTE — ED Triage Notes (Signed)
Pt states she just took first dose of Zepbound on Sunday.

## 2022-09-28 ENCOUNTER — Ambulatory Visit
Admission: RE | Admit: 2022-09-28 | Discharge: 2022-09-28 | Disposition: A | Discharge status: Home / Self Care | Payer: Self-pay | Source: Ambulatory Visit

## 2022-09-28 VITALS — BP 114/77 | HR 91 | Temp 98.6°F | Resp 18

## 2022-09-28 DIAGNOSIS — H1033 Unspecified acute conjunctivitis, bilateral: Secondary | ICD-10-CM

## 2022-09-28 MED ORDER — POLYMYXIN B-TRIMETHOPRIM 10000-0.1 UNIT/ML-% OP SOLN: 1.0000 [drp] | Freq: Four times a day (QID) | OPHTHALMIC | 0 refills | Status: AC

## 2022-09-28 NOTE — ED Triage Notes (Signed)
Patient to Urgent Care with complaints of bilateral eye drainage/ redness and irritation that started on Monday. Waking up with eyes crusted shut.

## 2022-09-28 NOTE — Discharge Instructions (Addendum)
Use the antibiotic eyedrops as prescribed.    Follow-up with your primary care provider if your symptoms are not improving.    Go to the emergency department if you have acute eye pain, changes in your vision, or other concerning symptoms.    

## 2022-09-28 NOTE — ED Provider Notes (Signed)
Jill [name]    CSN: [id number] Arrival date & time: 09/28/22  1147      History   Chief Complaint Chief Complaint  Patient presents with   Eye Problem    Pink Eye - Entered by patient    HPI Jill [name] is a 52 y.o. female.  Patient presents with bilateral eye redness, drainage, irritation, crusting in lashes x 3 days.  No eye injury, eye pain, change in vision, fever, or other symptoms.  No treatments at home.  Her medical history includes hypertension, pulmonary embolism, number cytopenia, thyroid disease. Patient was seen at [facility] ED on 09/20/2022; diagnosed with right otitis media; treated with amoxicillin.  The history is provided by the patient and medical records.    Past Medical History:  Diagnosis Date   Coronary artery disease    Left-sided   HTN (hypertension) 12/02/2018   Hyperlipemia 12/02/2018   Hyperlipidemia    Hypertension    Hypothyroid 12/02/2018   Lupus anticoagulant positive    Obesity    Pulmonary embolism (HCC)    Thrombocytopenia (HCC)    Thyroid disease    Vitamin D deficiency     Patient Active Problem List   Diagnosis Date Noted   HTN (hypertension) 12/02/2018   Hyperlipemia 12/02/2018   Hypothyroid 12/02/2018   Precordial pain 12/02/2018    Past Surgical History:  Procedure Laterality Date   NOVASURE ABLATION  2016    OB History   No obstetric history on file.      Home Medications    Prior to Admission medications   Medication Sig Start Date End Date Taking? Authorizing Provider  trimethoprim-polymyxin b (POLYTRIM) ophthalmic solution Place 1 drop into both eyes 4 (four) times daily for 7 days. 09/28/22 10/05/22 Yes Mickie Bail, NP  amLODipine (NORVASC) 5 MG tablet Take 5 mg by mouth daily.  06/27/16   [provider]  aspirin EC 81 MG tablet Take 81 mg by mouth daily.    [provider]  atorvastatin (LIPITOR) 40 MG tablet Take 40 mg by mouth daily at 6 PM.  07/10/16   [provider]  losartan (COZAAR) 100 MG tablet Take 100 mg by mouth daily.  07/15/16   [provider]  metoprolol tartrate (LOPRESSOR) 25 MG tablet Take 1 tablet (25 mg total) by mouth 2 (two) times daily. Please take 1 pill 2 hr before the CT scan. Please take second pill with you to the scan. This could be given to you, if necessary. Patient not taking: Reported on 09/28/2022 01/07/19 01/07/20  Elder Negus, MD  naproxen (NAPROSYN) 500 MG tablet Take 1 tablet (500 mg total) by mouth 2 (two) times daily with a meal. As needed for pain Patient not taking: Reported on 09/28/2022 08/08/19   Linwood Dibbles, MD  omeprazole (PRILOSEC) 20 MG capsule Take 1 capsule (20 mg total) by mouth daily. Patient not taking: Reported on 09/28/2022 08/08/19   Linwood Dibbles, MD  SYNTHROID 200 MCG tablet Take 200 mcg by mouth daily before breakfast.  05/29/16   [provider]  ZEPBOUND 2.5 MG/0.5ML Pen SMARTSIG:0.5 Milliliter(s) SUB-Q Once a Week    [provider]    Family History Family History  Problem Relation Age of Onset   Hypertension Mother    Breast cancer Mother    Skin cancer Father    Stroke Father    Heart attack Father    Hypertension Father    High Cholesterol Father  Neuropathy Neg Hx     Social History Social History   Tobacco Use   Smoking status: Never   Smokeless tobacco: Never  Vaping Use   Vaping Use: Never used  Substance Use Topics   Alcohol use: Yes    Comment: Rare   Drug use: No     Allergies   Patient has no known allergies.   Review of Systems Review of Systems  Constitutional:  Negative for chills and fever.  HENT:  Negative for ear pain and sore throat.   Eyes:  Positive for discharge, redness and itching. Negative for pain and visual disturbance.  Respiratory:  Negative for cough and shortness of breath.   Skin:  Negative for color change and rash.  All other systems reviewed and are negative.    Physical Exam Triage Vital  Signs ED Triage Vitals [09/28/22 1155]  Enc Vitals Group     BP      Pulse Rate 91     Resp 18     Temp 98.6 F (37 C)     Temp src      SpO2 96 %     Weight      Height      Head Circumference      Peak Flow      Pain Score      Pain Loc      Pain Edu?      Excl. in GC?    No data found.  Updated Vital Signs BP 114/77   Pulse 91   Temp 98.6 F (37 C)   Resp 18   SpO2 96%   Visual Acuity Right Eye Distance:   Left Eye Distance:   Bilateral Distance:    Right Eye Near:   Left Eye Near:    Bilateral Near:     Physical Exam Vitals and nursing note reviewed.  Constitutional:      General: She is not in acute distress.    Appearance: She is well-developed.  HENT:     Right Ear: Tympanic membrane normal.     Left Ear: Tympanic membrane normal.     Nose: Nose normal.     Mouth/Throat:     Mouth: Mucous membranes are moist.     Pharynx: Oropharynx is clear.  Eyes:     General: Lids are normal. Vision grossly intact.     Extraocular Movements: Extraocular movements intact.     Conjunctiva/sclera:     Right eye: Right conjunctiva is injected.     Left eye: Left conjunctiva is injected.     Pupils: Pupils are equal, round, and reactive to light.  Cardiovascular:     Rate and Rhythm: Normal rate and regular rhythm.     Heart sounds: Normal heart sounds.  Pulmonary:     Effort: Pulmonary effort is normal. No respiratory distress.     Breath sounds: Normal breath sounds.  Musculoskeletal:     Cervical back: Neck supple.  Skin:    General: Skin is warm and dry.  Neurological:     Mental Status: She is alert.  Psychiatric:        Mood and Affect: Mood normal.        Behavior: Behavior normal.      UC Treatments / Results  Labs (all labs ordered are listed, but only abnormal results are displayed) Labs Reviewed - No data to display  EKG   Radiology No results found.  Procedures Procedures (including critical care time)  Medications Ordered in  UC Medications - No data to display  Initial Impression / Assessment and Plan / UC Course  I have reviewed the triage vital signs and the nursing notes.  Pertinent labs & imaging results that were available during my care of the patient were reviewed by me and considered in my medical decision making (see chart for details).    Bilateral bacterial conjunctivitis.  Treating with Polytrim eyedrops.  Education provided on conjunctivitis.  Instructed patient to follow-up with PCP if symptoms are not improving.  ED precautions discussed.  Patient agrees to plan of care.   Final Clinical Impressions(s) / UC Diagnoses   Final diagnoses:  Acute bacterial conjunctivitis of both eyes     Discharge Instructions      Use the antibiotic eyedrops as prescribed.    Follow-up with your primary care provider if your symptoms are not improving.    Go to the emergency department if you have acute eye pain, changes in your vision, or other concerning symptoms.        ED Prescriptions     Medication Sig Dispense Auth. Provider   trimethoprim-polymyxin b (POLYTRIM) ophthalmic solution Place 1 drop into both eyes 4 (four) times daily for 7 days. 10 mL Mickie Bail, NP      PDMP not reviewed this encounter.   Mickie Bail, NP 09/28/22 1213

## 2022-11-16 DIAGNOSIS — E782 Mixed hyperlipidemia: Secondary | ICD-10-CM | POA: Diagnosis not present

## 2022-11-16 DIAGNOSIS — D473 Essential (hemorrhagic) thrombocythemia: Secondary | ICD-10-CM | POA: Diagnosis not present

## 2022-11-16 DIAGNOSIS — I10 Essential (primary) hypertension: Secondary | ICD-10-CM | POA: Diagnosis not present

## 2022-11-16 DIAGNOSIS — Z0000 Encounter for general adult medical examination without abnormal findings: Secondary | ICD-10-CM | POA: Diagnosis not present

## 2022-11-16 DIAGNOSIS — E039 Hypothyroidism, unspecified: Secondary | ICD-10-CM | POA: Diagnosis not present

## 2022-11-23 DIAGNOSIS — E782 Mixed hyperlipidemia: Secondary | ICD-10-CM | POA: Diagnosis not present

## 2022-11-23 DIAGNOSIS — Z0000 Encounter for general adult medical examination without abnormal findings: Secondary | ICD-10-CM | POA: Diagnosis not present

## 2022-11-23 DIAGNOSIS — R760 Raised antibody titer: Secondary | ICD-10-CM | POA: Diagnosis not present

## 2022-11-23 DIAGNOSIS — R748 Abnormal levels of other serum enzymes: Secondary | ICD-10-CM | POA: Diagnosis not present

## 2022-11-23 DIAGNOSIS — E039 Hypothyroidism, unspecified: Secondary | ICD-10-CM | POA: Diagnosis not present

## 2022-11-28 DIAGNOSIS — Z8041 Family history of malignant neoplasm of ovary: Secondary | ICD-10-CM | POA: Diagnosis not present

## 2022-11-28 DIAGNOSIS — Z01419 Encounter for gynecological examination (general) (routine) without abnormal findings: Secondary | ICD-10-CM | POA: Diagnosis not present

## 2022-11-28 DIAGNOSIS — Z803 Family history of malignant neoplasm of breast: Secondary | ICD-10-CM | POA: Diagnosis not present

## 2022-11-28 DIAGNOSIS — R87612 Low grade squamous intraepithelial lesion on cytologic smear of cervix (LGSIL): Secondary | ICD-10-CM | POA: Diagnosis not present

## 2022-11-28 DIAGNOSIS — Z6841 Body Mass Index (BMI) 40.0 and over, adult: Secondary | ICD-10-CM | POA: Diagnosis not present

## 2022-11-28 DIAGNOSIS — R87810 Cervical high risk human papillomavirus (HPV) DNA test positive: Secondary | ICD-10-CM | POA: Diagnosis not present

## 2022-11-28 DIAGNOSIS — Z1231 Encounter for screening mammogram for malignant neoplasm of breast: Secondary | ICD-10-CM | POA: Diagnosis not present

## 2022-11-28 DIAGNOSIS — Z124 Encounter for screening for malignant neoplasm of cervix: Secondary | ICD-10-CM | POA: Diagnosis not present

## 2023-03-13 DIAGNOSIS — L820 Inflamed seborrheic keratosis: Secondary | ICD-10-CM | POA: Diagnosis not present

## 2023-03-20 DIAGNOSIS — N3946 Mixed incontinence: Secondary | ICD-10-CM | POA: Diagnosis not present

## 2023-03-20 DIAGNOSIS — M6281 Muscle weakness (generalized): Secondary | ICD-10-CM | POA: Diagnosis not present

## 2023-04-18 DIAGNOSIS — R748 Abnormal levels of other serum enzymes: Secondary | ICD-10-CM | POA: Diagnosis not present

## 2023-04-18 DIAGNOSIS — E039 Hypothyroidism, unspecified: Secondary | ICD-10-CM | POA: Diagnosis not present

## 2023-05-16 DIAGNOSIS — R748 Abnormal levels of other serum enzymes: Secondary | ICD-10-CM | POA: Diagnosis not present

## 2023-05-16 DIAGNOSIS — I10 Essential (primary) hypertension: Secondary | ICD-10-CM | POA: Diagnosis not present

## 2023-05-16 DIAGNOSIS — E782 Mixed hyperlipidemia: Secondary | ICD-10-CM | POA: Diagnosis not present

## 2023-05-16 DIAGNOSIS — E039 Hypothyroidism, unspecified: Secondary | ICD-10-CM | POA: Diagnosis not present

## 2023-08-08 DIAGNOSIS — D485 Neoplasm of uncertain behavior of skin: Secondary | ICD-10-CM | POA: Diagnosis not present

## 2023-08-08 DIAGNOSIS — D1801 Hemangioma of skin and subcutaneous tissue: Secondary | ICD-10-CM | POA: Diagnosis not present

## 2023-08-08 DIAGNOSIS — L820 Inflamed seborrheic keratosis: Secondary | ICD-10-CM | POA: Diagnosis not present

## 2023-11-08 DIAGNOSIS — H9202 Otalgia, left ear: Secondary | ICD-10-CM | POA: Diagnosis not present

## 2023-11-28 DIAGNOSIS — I10 Essential (primary) hypertension: Secondary | ICD-10-CM | POA: Diagnosis not present

## 2023-11-28 DIAGNOSIS — E782 Mixed hyperlipidemia: Secondary | ICD-10-CM | POA: Diagnosis not present

## 2023-11-28 DIAGNOSIS — E039 Hypothyroidism, unspecified: Secondary | ICD-10-CM | POA: Diagnosis not present

## 2023-11-28 DIAGNOSIS — R748 Abnormal levels of other serum enzymes: Secondary | ICD-10-CM | POA: Diagnosis not present

## 2023-12-05 DIAGNOSIS — E039 Hypothyroidism, unspecified: Secondary | ICD-10-CM | POA: Diagnosis not present

## 2023-12-05 DIAGNOSIS — I10 Essential (primary) hypertension: Secondary | ICD-10-CM | POA: Diagnosis not present

## 2023-12-05 DIAGNOSIS — E782 Mixed hyperlipidemia: Secondary | ICD-10-CM | POA: Diagnosis not present

## 2023-12-05 DIAGNOSIS — Z0000 Encounter for general adult medical examination without abnormal findings: Secondary | ICD-10-CM | POA: Diagnosis not present
